# Patient Record
Sex: Male | Born: 1965 | Race: Asian | Hispanic: No | Marital: Married | Smoking: Never smoker
Health system: Southern US, Community
[De-identification: ages and names within clinical notes are randomized; demographics above are authoritative.]

## PROBLEM LIST (undated history)

## (undated) ENCOUNTER — Emergency Department (HOSPITAL_COMMUNITY): Admission: EM | Discharge status: Absent without leave | Payer: Self-pay

## (undated) DIAGNOSIS — I10 Essential (primary) hypertension: Secondary | ICD-10-CM

## (undated) DIAGNOSIS — R7303 Prediabetes: Secondary | ICD-10-CM

## (undated) HISTORY — DX: Essential (primary) hypertension: I10

## (undated) HISTORY — PX: HERNIA REPAIR: SHX51

## (undated) HISTORY — DX: Prediabetes: R73.03

---

## 2006-03-24 ENCOUNTER — Inpatient Hospital Stay (HOSPITAL_COMMUNITY): Admission: EM | Admit: 2006-03-24 | Discharge: 2006-03-25 | Payer: Self-pay | Admitting: Emergency Medicine

## 2015-06-13 ENCOUNTER — Emergency Department (HOSPITAL_BASED_OUTPATIENT_CLINIC_OR_DEPARTMENT_OTHER)
Admission: EM | Admit: 2015-06-13 | Discharge: 2015-06-13 | Disposition: A | Discharge status: Home / Self Care | Payer: Worker's Compensation | Attending: Emergency Medicine | Admitting: Emergency Medicine

## 2015-06-13 ENCOUNTER — Emergency Department (HOSPITAL_BASED_OUTPATIENT_CLINIC_OR_DEPARTMENT_OTHER): Payer: Worker's Compensation

## 2015-06-13 ENCOUNTER — Encounter (HOSPITAL_BASED_OUTPATIENT_CLINIC_OR_DEPARTMENT_OTHER): Payer: Self-pay | Admitting: *Deleted

## 2015-06-13 DIAGNOSIS — Y288XXA Contact with other sharp object, undetermined intent, initial encounter: Secondary | ICD-10-CM | POA: Insufficient documentation

## 2015-06-13 DIAGNOSIS — IMO0002 Reserved for concepts with insufficient information to code with codable children: Secondary | ICD-10-CM

## 2015-06-13 DIAGNOSIS — Y990 Civilian activity done for income or pay: Secondary | ICD-10-CM | POA: Diagnosis not present

## 2015-06-13 DIAGNOSIS — Y9289 Other specified places as the place of occurrence of the external cause: Secondary | ICD-10-CM | POA: Diagnosis not present

## 2015-06-13 DIAGNOSIS — S61212A Laceration without foreign body of right middle finger without damage to nail, initial encounter: Secondary | ICD-10-CM | POA: Insufficient documentation

## 2015-06-13 DIAGNOSIS — Z23 Encounter for immunization: Secondary | ICD-10-CM | POA: Insufficient documentation

## 2015-06-13 DIAGNOSIS — Y9389 Activity, other specified: Secondary | ICD-10-CM | POA: Diagnosis not present

## 2015-06-13 MED ORDER — LIDOCAINE HCL (PF) 1 % IJ SOLN
5.0000 mL | Freq: Once | INTRAMUSCULAR | Status: DC
  Filled 2015-06-13: qty 5

## 2015-06-13 MED ORDER — TETANUS-DIPHTH-ACELL PERTUSSIS 5-2.5-18.5 LF-MCG/0.5 IM SUSP
0.5000 mL | Freq: Once | INTRAMUSCULAR | Status: AC
  Administered 2015-06-13: 0.5 mL via INTRAMUSCULAR
  Filled 2015-06-13: qty 0.5

## 2015-06-13 NOTE — ED Provider Notes (Signed)
CSN: [id number]     Arrival date & time 06/13/15  1728 History   First MD Initiated Contact with Patient 06/13/15 2102     Chief Complaint  Patient presents with  . Extremity Laceration   HPI   49 year old male presents today with a laceration to his right third finger. Patient does not speak [place], his niece was present at the time of evaluation. Patient was asked if he would like an interpreter he refused reporting that he would like her to interpret. Patient was at work when a piece of machinery sliced his finger, he reports pain is minimal, bleeding controlled with direct pressure minimal amount of blood loss. Patient reports his tetanus is not up-to-date. He has full active resisted flexion and extension of the affected fingers and hands. No history of diabetes or wound infections. No other complaints  No past medical history on file. Past Surgical History  Procedure Laterality Date  . Hernia repair     No family history on file. Social History  Substance Use Topics  . Smoking status: Never Smoker   . Smokeless tobacco: Not on file  . Alcohol Use: No    Review of Systems  All other systems reviewed and are negative.    Allergies  Review of patient's allergies indicates no known allergies.  Home Medications   Prior to Admission medications   Not on File   BP 164/109 mmHg  Pulse 66  Temp(Src) 98.4 F (36.9 C) (Oral)  Resp 20  Ht  (1.727 m)  Wt 175 lb (79.379 kg)  BMI 26.61 kg/m2  SpO2 99% Physical Exam  Constitutional: He is oriented to person, place, and time. He appears well-developed and well-nourished.  HENT:  Head: Normocephalic and atraumatic.  Eyes: Conjunctivae are normal. Pupils are equal, round, and reactive to light. Right eye exhibits no discharge. Left eye exhibits no discharge. No scleral icterus.  Neck: Normal range of motion. No JVD present. No tracheal deviation present.  Pulmonary/Chest: Effort normal. No stridor.  Neurological: He is  alert and oriented to person, place, and time. Coordination normal.  Skin:  1.5 cm linear laceration to the palmar aspect of the third digit right hand, no active bleeding, no tendon, major vessel, nerve involvement. Patient has full resisted flexion and extension of the DIP PIP, and MCP. Sensation intact Refill less than 3 seconds  Psychiatric: He has a normal mood and affect. His behavior is normal. Judgment and thought content normal.  Nursing note and vitals reviewed.   ED Course  Procedures (including critical care time)  LACERATION REPAIR Performed by: Thermon Leyland Authorized by: Thermon Leyland Consent: Verbal consent obtained. Risks and benefits: risks, benefits and alternatives were discussed Consent given by: patient Patient identity confirmed: provided demographic data Prepped and Draped in normal sterile fashion Wound explored  Laceration Location: Palmer aspect of the third digit right hand  Laceration Length: 1.5 cm  No Foreign Bodies seen or palpated  Anesthesia: local infiltration  Local anesthetic: lidocaine 1 % 0 epinephrine  Anesthetic total: 2 ml  Irrigation method: syringe Amount of cleaning: standard  Skin closure: Simple interrupted   Number of sutures: 4   Technique: Approximation   Patient tolerance: Patient tolerated the procedure well with no immediate complications. Labs Review Labs Reviewed - No data to display  Imaging Review Dg Hand Complete Right  06/13/2015   CLINICAL DATA:  Laceration to the distal right middle finger on work machine. Initial encounter.  EXAM: RIGHT HAND - COMPLETE  3+ VIEW  COMPARISON:  None.  FINDINGS: There is no evidence of fracture or dislocation. The joint spaces are preserved. The carpal rows are intact, and demonstrate normal alignment. The known soft tissue laceration is characterized at the volar aspect of the distal third finger. No radiopaque foreign bodies are seen.  IMPRESSION: No evidence of  fracture or dislocation. No radiopaque foreign bodies seen.   Electronically Signed   By: Roanna Raider M.D.   On: 06/13/2015 22:00   I have personally reviewed and evaluated these images and lab results as part of my medical decision-making.   EKG Interpretation None      MDM   Final diagnoses:  Laceration    Labs:  Imaging:  Consults:  Therapeutics:  Discharge Meds:   Assessment/Plan: Patient presents with a simple laceration, plain films show no foreign body or bone involvement. Wound is repaired, tetanus updated. She given strict return precautions and instructions for suture removal. Patient verbalizes understanding and agreement for today's plan.         Eyvonne Mechanic, PA-C 06/15/15 1547  Geoffery Lyons, MD 06/15/15 [phone]

## 2015-06-13 NOTE — ED Notes (Signed)
Daughter assisted with answering questions secondary to pt does not speak english very well, she states she is coming to the ED to be with father , currently pts supervisor is here with pt

## 2015-06-13 NOTE — ED Notes (Signed)
Laceration on work machine approx 1645 hours today, injury to rt middle finger, arrived with dsg in place

## 2015-06-13 NOTE — Discharge Instructions (Signed)
Please reattach information, please keep wound clean and covered. Please monitor for signs of infection and return immediately if any present. Please have sutures removed in 5-7 days.

## 2019-06-02 ENCOUNTER — Other Ambulatory Visit: Payer: Self-pay

## 2019-06-02 ENCOUNTER — Emergency Department (HOSPITAL_COMMUNITY): Payer: HRSA Program

## 2019-06-02 ENCOUNTER — Emergency Department (HOSPITAL_COMMUNITY)
Admission: EM | Admit: 2019-06-02 | Discharge: 2019-06-03 | Disposition: A | Discharge status: Home / Self Care | Payer: HRSA Program | Attending: Emergency Medicine | Admitting: Emergency Medicine

## 2019-06-02 DIAGNOSIS — R0789 Other chest pain: Secondary | ICD-10-CM | POA: Diagnosis not present

## 2019-06-02 DIAGNOSIS — R112 Nausea with vomiting, unspecified: Secondary | ICD-10-CM | POA: Insufficient documentation

## 2019-06-02 DIAGNOSIS — U071 COVID-19: Secondary | ICD-10-CM | POA: Insufficient documentation

## 2019-06-02 DIAGNOSIS — R5383 Other fatigue: Secondary | ICD-10-CM | POA: Diagnosis present

## 2019-06-02 DIAGNOSIS — R05 Cough: Secondary | ICD-10-CM | POA: Insufficient documentation

## 2019-06-02 DIAGNOSIS — R5381 Other malaise: Secondary | ICD-10-CM | POA: Insufficient documentation

## 2019-06-02 LAB — CBC
HCT: 48.8 % (ref 39.0–52.0)
Hemoglobin: 15.9 g/dL (ref 13.0–17.0)
MCH: 25.7 pg — ABNORMAL LOW (ref 26.0–34.0)
MCHC: 32.6 g/dL (ref 30.0–36.0)
MCV: 79 fL — ABNORMAL LOW (ref 80.0–100.0)
Platelets: 154 10*3/uL (ref 150–400)
RBC: 6.18 MIL/uL — ABNORMAL HIGH (ref 4.22–5.81)
RDW: 13.5 % (ref 11.5–15.5)
WBC: 7 10*3/uL (ref 4.0–10.5)
nRBC: 0 % (ref 0.0–0.2)

## 2019-06-02 LAB — BASIC METABOLIC PANEL
Anion gap: 14 (ref 5–15)
BUN: 14 mg/dL (ref 6–20)
CO2: 17 mmol/L — ABNORMAL LOW (ref 22–32)
Calcium: 8.7 mg/dL — ABNORMAL LOW (ref 8.9–10.3)
Chloride: 99 mmol/L (ref 98–111)
Creatinine, Ser: 1.26 mg/dL — ABNORMAL HIGH (ref 0.61–1.24)
GFR calc Af Amer: >60 mL/min (ref >60)
GFR calc non Af Amer: >60 mL/min (ref >60)
Glucose, Bld: 109 mg/dL — ABNORMAL HIGH (ref 70–99)
Potassium: 4 mmol/L (ref 3.5–5.1)
Sodium: 130 mmol/L — ABNORMAL LOW (ref 135–145)

## 2019-06-02 LAB — TROPONIN I (HIGH SENSITIVITY): Troponin I (High Sensitivity): 8 ng/L (ref <18)

## 2019-06-02 LAB — SARS CORONAVIRUS 2 BY RT PCR (HOSPITAL ORDER, PERFORMED IN ~~LOC~~ HOSPITAL LAB): SARS Coronavirus 2: POSITIVE — AB

## 2019-06-02 MED ORDER — ACETAMINOPHEN 325 MG PO TABS
650.0000 mg | ORAL_TABLET | Freq: Once | ORAL | Status: AC
Start: 2019-06-02 — End: 2019-06-03
  Administered 2019-06-03: 650 mg via ORAL
  Filled 2019-06-02: qty 2

## 2019-06-02 NOTE — ED Triage Notes (Signed)
Patient c/o CP, cough, fever and being unable to eat x3 days.

## 2019-06-02 NOTE — ED Notes (Signed)
Unable to update vitals as pt is in room in green waiting for a room to see the provider

## 2019-06-03 LAB — HEPATIC FUNCTION PANEL
ALT: 43 U/L (ref 0–44)
AST: 49 U/L — ABNORMAL HIGH (ref 15–41)
Albumin: 3.7 g/dL (ref 3.5–5.0)
Alkaline Phosphatase: 131 U/L — ABNORMAL HIGH (ref 38–126)
Bilirubin, Direct: 0.2 mg/dL (ref 0.0–0.2)
Indirect Bilirubin: 0.7 mg/dL (ref 0.3–0.9)
Total Bilirubin: 0.9 mg/dL (ref 0.3–1.2)
Total Protein: 7.7 g/dL (ref 6.5–8.1)

## 2019-06-03 LAB — LIPASE, BLOOD: Lipase: 56 U/L — ABNORMAL HIGH (ref 11–51)

## 2019-06-03 LAB — TROPONIN I (HIGH SENSITIVITY): Troponin I (High Sensitivity): 7 ng/L (ref <18)

## 2019-06-03 MED ORDER — ONDANSETRON 4 MG PO TBDP: 4.0000 mg | ORAL_TABLET | Freq: Three times a day (TID) | ORAL | 0 refills | Status: AC | PRN

## 2019-06-03 MED ORDER — ONDANSETRON 4 MG PO TBDP
4.0000 mg | ORAL_TABLET | Freq: Once | ORAL | Status: AC
  Administered 2019-06-03: 4 mg via ORAL
  Filled 2019-06-03: qty 1

## 2019-06-03 MED ORDER — LIDOCAINE VISCOUS HCL 2 % MT SOLN
15.0000 mL | Freq: Once | OROMUCOSAL | Status: AC
  Administered 2019-06-03: 15 mL via ORAL
  Filled 2019-06-03: qty 15

## 2019-06-03 MED ORDER — ALUM & MAG HYDROXIDE-SIMETH 400-400-40 MG/5ML PO SUSP: 15.0000 mL | Freq: Four times a day (QID) | ORAL | 0 refills | Status: DC | PRN

## 2019-06-03 MED ORDER — ALUM & MAG HYDROXIDE-SIMETH 200-200-20 MG/5ML PO SUSP
30.0000 mL | Freq: Once | ORAL | Status: AC
  Administered 2019-06-03: 30 mL via ORAL
  Filled 2019-06-03: qty 30

## 2019-06-03 NOTE — ED Provider Notes (Signed)
[facility] Provider Note  CSN: [id number] Arrival date & time: 06/02/19 2114  Chief Complaint(s) Chest Pain  HPI Cameron Allen is a 53 y.o. [place] male here with 4 days of generalized fatigue, malaise, dry cough, and chest discomfort with eating.  Patient unable to describe the chest discomfort but states it is associated with eating.  States that every time he tries to drink or eat something he throws up.  Denies any bloody emesis.  No known sick contacts.  No suspicious food intake.  Denies any shortness of breath but is notably tachypneic.  No abdominal pain. No diarrhea.   The history is limited by a language barrier. A language interpreter was used.    Past Medical History No past medical history on file. There are no active problems to display for this patient.  Home Medication(s) Prior to Admission medications   Medication Sig Start Date End Date Taking? Authorizing Provider  alum & mag hydroxide-simeth (MAALOX ADVANCED MAX ST) 400-400-40 MG/5ML suspension Take 15 mLs by mouth every 6 (six) hours as needed for indigestion. 06/03/19   Nira Connardama, [facility] Eduardo, MD  ondansetron (ZOFRAN ODT) 4 MG disintegrating tablet Take 1 tablet (4 mg total) by mouth every 8 (eight) hours as needed for up to 3 days for nausea or vomiting. 06/03/19 06/06/19  Dina Warbington, Amadeo GarnetPedro Eduardo, MD                                                                                                                                    Past Surgical History ** The histories are not reviewed yet. Please review them in the "History" navigator section and refresh this SmartLink. Family History No family history on file.  Social History Social History   Tobacco Use  . Smoking status: Never Smoker  Substance Use Topics  . Alcohol use: No  . Drug use: No   Allergies Patient has no known allergies.  Review of Systems Review of Systems All other systems are reviewed and are  negative for acute change except as noted in the HPI  Physical Exam Vital Signs  I have reviewed the triage vital signs BP (!) 175/104 (BP Location: Right Arm)   Pulse 91   Temp 99.4 F (37.4 C) (Oral)   Resp (!) 30   SpO2 98%   Physical Exam Vitals signs reviewed.  Constitutional:      General: He is not in acute distress.    Appearance: He is well-developed. He is not diaphoretic.  HENT:     Head: Normocephalic and atraumatic.     Nose: Nose normal.  Eyes:     General: No scleral icterus.       Right eye: No discharge.        Left eye: No discharge.     Conjunctiva/sclera: Conjunctivae normal.     Pupils: Pupils are equal, round, and reactive to light.  Neck:  Musculoskeletal: Normal range of motion and neck supple.  Cardiovascular:     Rate and Rhythm: Normal rate and regular rhythm.     Heart sounds: No murmur. No friction rub. No gallop.   Pulmonary:     Effort: Pulmonary effort is normal. Tachypnea present. No respiratory distress.     Breath sounds: No stridor. Examination of the right-lower field reveals rales. Examination of the left-lower field reveals rales. Rales present.  Abdominal:     General: There is no distension.     Palpations: Abdomen is soft.     Tenderness: There is no abdominal tenderness.  Musculoskeletal:        General: No tenderness.  Skin:    General: Skin is warm and dry.     Findings: No erythema or rash.  Neurological:     Mental Status: He is alert and oriented to person, place, and time.     ED Results and Treatments Labs (all labs ordered are listed, but only abnormal results are displayed) Labs Reviewed  SARS CORONAVIRUS 2 (HOSPITAL ORDER, [facility]) - Abnormal; Notable for the following components:      Result Value   SARS Coronavirus 2 POSITIVE (*)    All other components within normal limits  BASIC METABOLIC PANEL - Abnormal; Notable for the following components:   Sodium 130 (*)    CO2 17  (*)    Glucose, Bld 109 (*)    Creatinine, Ser 1.26 (*)    Calcium 8.7 (*)    All other components within normal limits  CBC - Abnormal; Notable for the following components:   RBC 6.18 (*)    MCV 79.0 (*)    MCH 25.7 (*)    All other components within normal limits  HEPATIC FUNCTION PANEL - Abnormal; Notable for the following components:   AST 49 (*)    Alkaline Phosphatase 131 (*)    All other components within normal limits  LIPASE, BLOOD - Abnormal; Notable for the following components:   Lipase 56 (*)    All other components within normal limits  TROPONIN I (HIGH SENSITIVITY)  TROPONIN I (HIGH SENSITIVITY)                                                                                                                         EKG Not crossed over from MUSE      Radiology Dg Chest Portable 1 View  Result Date: 06/02/2019 CLINICAL DATA:  53 year old male with chest pain. EXAM: PORTABLE CHEST 1 VIEW COMPARISON:  None. FINDINGS: Bibasilar hazy densities likely represent atelectatic changes. Infiltrate is less likely but not excluded. Clinical correlation is recommended. No focal consolidation, pleural effusion, or pneumothorax. Top-normal cardiac size. No acute osseous pathology. Old healed left posterior rib fractures. IMPRESSION: Bibasilar hazy densities, likely atelectatic changes. Infiltrate is less likely but not excluded. Electronically Signed   By: Anner Crete M.D.   On: 06/02/2019 22:06    Pertinent  labs & imaging results that were available during my care of the patient were reviewed by me and considered in my medical decision making (see chart for details).  Medications Ordered in ED Medications  acetaminophen (TYLENOL) tablet 650 mg (650 mg Oral Given 06/03/19 0110)  ondansetron (ZOFRAN-ODT) disintegrating tablet 4 mg (4 mg Oral Given 06/03/19 0111)  alum & mag hydroxide-simeth (MAALOX/MYLANTA) 200-200-20 MG/5ML suspension 30 mL (30 mLs Oral Given 06/03/19 0113)     And  lidocaine (XYLOCAINE) 2 % viscous mouth solution 15 mL (15 mLs Oral Given 06/03/19 0113)                                                                                                                                    Procedures Procedures  (including critical care time)  Medical Decision Making / ED Course I have reviewed the nursing notes for this encounter and the patient's prior records (if available in EHR or on provided paperwork).   Cameron Allen was evaluated in Emergency Department on 06/03/2019 for the symptoms described in the history of present illness. He was evaluated in the context of the global COVID-19 pandemic, which necessitated consideration that the patient might be at risk for infection with the SARS-CoV-2 virus that causes COVID-19. Institutional protocols and algorithms that pertain to the evaluation of patients at risk for COVID-19 are in a state of rapid change based on information released by regulatory bodies including the CDC and federal and state organizations. These policies and algorithms were followed during the patient's care in the ED.  Patient presents with viral symptoms for 4 days. decreased oral hydration. Rest of history as above.  Patient appears ill. No signs of toxicity, patient is interactive. No hypoxia, but tachypneic to 30s and 40s. Lung exam with bibasilar rales. Rest of exam as above.  Most consistent with viral illness. Covid +  No evidence suggestive of pharyngitis, AOM, or meningitis.  EKG with LBBB (no priors for comparison). trops negative x2. Pain inconsistent with ACS. Doubt PE.   Chest x-ray notable for likely viral pneumonia. No pneumothorax, pneumomediastinum.  No abnormal contour of the mediastinum to suggest dissection.   Mild AKI likely from dehydration. No evidence of biliary obstruction or pancreatitis.   Treated with Zofran, GI cocktail and oral hydration.   Able to tolerate oral intake. Pain and respiratory rate  improved.   .The patient is safe for discharge with strict return precautions.      Final Clinical Impression(s) / ED Diagnoses Final diagnoses:  COVID-19 virus infection  Non-intractable vomiting with nausea, unspecified vomiting type  Chest discomfort    The patient appears reasonably screened and/or stabilized for discharge and I doubt any other medical condition or other [facility] requiring further screening, evaluation, or treatment in the ED at this time prior to discharge.  Disposition: Discharge  Condition: Good  I have discussed the results, Dx and Tx plan with the patient  who expressed understanding and agree(s) with the plan. Discharge instructions discussed at great length. The patient was given strict return precautions who verbalized understanding of the instructions. No further questions at time of discharge.    ED Discharge Orders         Ordered    ondansetron (ZOFRAN ODT) 4 MG disintegrating tablet  Every 8 hours PRN     06/03/19 0349    alum & mag hydroxide-simeth (MAALOX ADVANCED MAX ST) 400-400-40 MG/5ML suspension  Every 6 hours PRN     06/03/19 0349           Follow Up: Primary care provider  Schedule an appointment as soon as possible for a visit  As needed      This chart was dictated using voice recognition software.  Despite best efforts to proofread,  errors can occur which can change the documentation meaning.   Nira Connardama, Ludella Pranger Eduardo, MD 06/03/19 [phone]

## 2019-06-03 NOTE — ED Notes (Signed)
The pt is asleep easy respirations

## 2019-06-03 NOTE — ED Notes (Signed)
[place] given

## 2020-11-27 ENCOUNTER — Ambulatory Visit: Payer: BC Managed Care – PPO | Attending: Internal Medicine

## 2020-11-27 DIAGNOSIS — Z23 Encounter for immunization: Secondary | ICD-10-CM

## 2020-11-27 NOTE — Progress Notes (Signed)
   Covid-19 Vaccination Clinic  Name:  Math Brazie    MRN: [id number] DOB: 1966/06/13  11/27/2020  Mr. Wilbanks was observed post Covid-19 immunization for 15 minutes without incident. He was provided with Vaccine Information Sheet and instruction to access the V-Safe system.   Mr. Stettler was instructed to call 911 with any severe reactions post vaccine: Marland Kitchen Difficulty breathing  . Swelling of face and throat  . A fast heartbeat  . A bad rash all over body  . Dizziness and weakness   Immunizations Administered    Name Date Dose VIS Date Route   JANSSEN COVID-19 VACCINE 11/27/2020 11:09 AM 0.5 mL 08/12/2020 Intramuscular   Manufacturer: [employer]   Lot: [id number]   NDC: [phone]

## 2021-09-24 ENCOUNTER — Emergency Department (HOSPITAL_BASED_OUTPATIENT_CLINIC_OR_DEPARTMENT_OTHER): Payer: No Typology Code available for payment source

## 2021-09-24 ENCOUNTER — Emergency Department (HOSPITAL_BASED_OUTPATIENT_CLINIC_OR_DEPARTMENT_OTHER)
Admission: EM | Admit: 2021-09-24 | Discharge: 2021-09-25 | Disposition: A | Discharge status: Home / Self Care | Payer: No Typology Code available for payment source | Attending: Emergency Medicine | Admitting: Emergency Medicine

## 2021-09-24 ENCOUNTER — Encounter (HOSPITAL_BASED_OUTPATIENT_CLINIC_OR_DEPARTMENT_OTHER): Payer: Self-pay | Admitting: Emergency Medicine

## 2021-09-24 ENCOUNTER — Emergency Department (HOSPITAL_BASED_OUTPATIENT_CLINIC_OR_DEPARTMENT_OTHER): Payer: No Typology Code available for payment source | Admitting: Radiology

## 2021-09-24 ENCOUNTER — Other Ambulatory Visit: Payer: Self-pay

## 2021-09-24 DIAGNOSIS — M542 Cervicalgia: Secondary | ICD-10-CM | POA: Insufficient documentation

## 2021-09-24 DIAGNOSIS — M25522 Pain in left elbow: Secondary | ICD-10-CM | POA: Insufficient documentation

## 2021-09-24 DIAGNOSIS — Y92410 Unspecified street and highway as the place of occurrence of the external cause: Secondary | ICD-10-CM | POA: Insufficient documentation

## 2021-09-24 DIAGNOSIS — R519 Headache, unspecified: Secondary | ICD-10-CM | POA: Insufficient documentation

## 2021-09-24 DIAGNOSIS — R0789 Other chest pain: Secondary | ICD-10-CM | POA: Insufficient documentation

## 2021-09-24 MED ORDER — KETOROLAC TROMETHAMINE 30 MG/ML IJ SOLN
30.0000 mg | Freq: Once | INTRAMUSCULAR | Status: AC
  Administered 2021-09-24: 30 mg via INTRAMUSCULAR
  Filled 2021-09-24: qty 1

## 2021-09-24 NOTE — ED Notes (Signed)
Per [facility], they do not have [place] interpreter available. Pt has friend to call whose english is better. As this is our only option, we will use this angle to be able to care for pt.
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
## 2021-09-24 NOTE — ED Notes (Signed)
[place] interpreter accessed via [facility] [employer] [facility] (voice); no video interpreter available in this language.
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
## 2021-09-25 NOTE — ED Notes (Signed)
Pt discharged home after verbalizing understanding of discharge instructions; nad noted.  Translation through friend as no interpreter available either by video or phone.
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
## 2023-02-17 ENCOUNTER — Telehealth: Payer: Self-pay

## 2023-02-17 NOTE — Telephone Encounter (Signed)
Home visit to remind patient of appointment on 02/20/2023 at 10:45 am with [facility] per request from [facility] at the clinic.  Patient was not home but his roommate was able to call him and CN spoke with him.  He requested address of appointment.  CN left written information with time, location and phone number of facility.  Told patient he could take written note and ask someone at entrance [facility]) to direct him to the clinic.  Brantley Fling RN, Congregational Nurse [phone]

## 2023-02-20 ENCOUNTER — Ambulatory Visit: Payer: BC Managed Care – PPO | Admitting: Student

## 2023-02-20 VITALS — BP 163/97 | HR 72 | Temp 97.9°F | Ht 66.0 in | Wt 180.0 lb

## 2023-02-20 DIAGNOSIS — R748 Abnormal levels of other serum enzymes: Secondary | ICD-10-CM

## 2023-02-20 DIAGNOSIS — R7303 Prediabetes: Secondary | ICD-10-CM | POA: Diagnosis not present

## 2023-02-20 DIAGNOSIS — Z1211 Encounter for screening for malignant neoplasm of colon: Secondary | ICD-10-CM

## 2023-02-20 DIAGNOSIS — Z114 Encounter for screening for human immunodeficiency virus [HIV]: Secondary | ICD-10-CM

## 2023-02-20 DIAGNOSIS — Z13220 Encounter for screening for lipoid disorders: Secondary | ICD-10-CM

## 2023-02-20 DIAGNOSIS — I10 Essential (primary) hypertension: Secondary | ICD-10-CM

## 2023-02-20 DIAGNOSIS — Z1159 Encounter for screening for other viral diseases: Secondary | ICD-10-CM

## 2023-02-20 DIAGNOSIS — Z0000 Encounter for general adult medical examination without abnormal findings: Secondary | ICD-10-CM

## 2023-02-20 DIAGNOSIS — R21 Rash and other nonspecific skin eruption: Secondary | ICD-10-CM

## 2023-02-20 DIAGNOSIS — E119 Type 2 diabetes mellitus without complications: Secondary | ICD-10-CM

## 2023-02-20 MED ORDER — LOSARTAN POTASSIUM-HCTZ 50-12.5 MG PO TABS: 1.0000 | ORAL_TABLET | Freq: Every day | ORAL | 5 refills | Status: DC

## 2023-02-20 MED ORDER — TRIAMCINOLONE ACETONIDE 0.1 % EX OINT: 1.0000 | TOPICAL_OINTMENT | Freq: Two times a day (BID) | CUTANEOUS | 1 refills | Status: DC

## 2023-02-20 NOTE — Patient Instructions (Signed)
Thank you, Mr.Shann Thong for allowing [place] to provide your care today. Today we establish you in our clinic and discussed your diabetes, rash and high blood pressure.    I have ordered the following labs for you:   Lab Orders         Hemoglobin A1c         CMP14 + Anion Gap         CBC no Diff         HIV antibody (with reflex)         Hepatitis C Ab reflex to Quant PCR         Lipid Profile      I will call if any are abnormal. All of your labs can be accessed through "My Chart".  I have place a referrals to GI for colonoscopy  I have ordered the following medication/changed the following medications:  Start Hyzaar 50-12.5 mg daily Start triamcinolone ointment 0.1% twice daily  My Chart Access: [email or web address]  Please follow-up in 4 weeks  Please make sure to arrive 15 minutes prior to your next appointment. If you arrive late, you may be asked to reschedule.    We look forward to seeing you next time. Please call our clinic at [phone] if you have any questions or concerns. The best time to call is Monday-Friday from 9am-4pm, but there is someone available 24/7. If after hours or the weekend, call the main hospital number and ask for the Internal Medicine Resident On-Call. If you need medication refills, please notify your pharmacy one week in advance and they will send [place] a request.   Thank you for letting us take part in your care. Wishing you the best!  Steffanie Rainwater, MD 02/20/2023, 11:50 AM IM Resident, PGY-3 Duwayne Heck 41:10
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
## 2023-02-21 LAB — CMP14 + ANION GAP
ALT: 25 IU/L (ref 0–44)
AST: 20 IU/L (ref 0–40)
Albumin/Globulin Ratio: 1.9 (ref 1.2–2.2)
Albumin: 4.5 g/dL (ref 3.8–4.9)
Alkaline Phosphatase: 144 IU/L — ABNORMAL HIGH (ref 44–121)
Anion Gap: 15 mmol/L (ref 10.0–18.0)
BUN/Creatinine Ratio: 12 (ref 9–20)
BUN: 11 mg/dL (ref 6–24)
Bilirubin Total: 0.2 mg/dL (ref 0.0–1.2)
CO2: 20 mmol/L (ref 20–29)
Calcium: 9.7 mg/dL (ref 8.7–10.2)
Chloride: 106 mmol/L (ref 96–106)
Creatinine, Ser: 0.94 mg/dL (ref 0.76–1.27)
Globulin, Total: 2.4 g/dL (ref 1.5–4.5)
Glucose: 103 mg/dL — ABNORMAL HIGH (ref 70–99)
Potassium: 4.1 mmol/L (ref 3.5–5.2)
Sodium: 141 mmol/L (ref 134–144)
Total Protein: 6.9 g/dL (ref 6.0–8.5)
eGFR: 95 mL/min/{1.73_m2} (ref >59)

## 2023-02-21 LAB — HCV AB W REFLEX TO QUANT PCR: HCV Ab: NONREACTIVE

## 2023-02-21 LAB — CBC
Hematocrit: 47.5 % (ref 37.5–51.0)
Hemoglobin: 14.8 g/dL (ref 13.0–17.7)
MCH: 25.1 pg — ABNORMAL LOW (ref 26.6–33.0)
MCHC: 31.2 g/dL — ABNORMAL LOW (ref 31.5–35.7)
MCV: 81 fL (ref 79–97)
Platelets: 233 10*3/uL (ref 150–450)
RBC: 5.9 x10E6/uL — ABNORMAL HIGH (ref 4.14–5.80)
RDW: 14.5 % (ref 11.6–15.4)
WBC: 6.3 10*3/uL (ref 3.4–10.8)

## 2023-02-21 LAB — LIPID PANEL
Chol/HDL Ratio: 3.9 ratio (ref 0.0–5.0)
Cholesterol, Total: 199 mg/dL (ref 100–199)
HDL: 51 mg/dL (ref >39)
LDL Chol Calc (NIH): 90 mg/dL (ref 0–99)
Triglycerides: 354 mg/dL — ABNORMAL HIGH (ref 0–149)
VLDL Cholesterol Cal: 58 mg/dL — ABNORMAL HIGH (ref 5–40)

## 2023-02-21 LAB — HIV ANTIBODY (ROUTINE TESTING W REFLEX): HIV Screen 4th Generation wRfx: NONREACTIVE

## 2023-02-21 LAB — HCV INTERPRETATION

## 2023-02-21 LAB — HEMOGLOBIN A1C
Est. average glucose Bld gHb Est-mCnc: 131 mg/dL
Hgb A1c MFr Bld: 6.2 % — ABNORMAL HIGH (ref 4.8–5.6)

## 2023-02-22 ENCOUNTER — Encounter: Payer: Self-pay | Admitting: Student

## 2023-02-22 DIAGNOSIS — I10 Essential (primary) hypertension: Secondary | ICD-10-CM | POA: Insufficient documentation

## 2023-02-22 DIAGNOSIS — Z0000 Encounter for general adult medical examination without abnormal findings: Secondary | ICD-10-CM | POA: Insufficient documentation

## 2023-02-22 DIAGNOSIS — R21 Rash and other nonspecific skin eruption: Secondary | ICD-10-CM | POA: Insufficient documentation

## 2023-02-22 DIAGNOSIS — R748 Abnormal levels of other serum enzymes: Secondary | ICD-10-CM | POA: Insufficient documentation

## 2023-02-22 DIAGNOSIS — R7303 Prediabetes: Secondary | ICD-10-CM | POA: Insufficient documentation

## 2023-02-22 DIAGNOSIS — E119 Type 2 diabetes mellitus without complications: Secondary | ICD-10-CM | POA: Insufficient documentation

## 2023-02-22 HISTORY — DX: Abnormal levels of other serum enzymes: R74.8

## 2023-02-22 NOTE — Assessment & Plan Note (Signed)
Patient states he was previously on medication for diabetes but does not remember the name. His A1c today is at 6.2% which puts him in the prediabetes range. I counseled patient on making lifestyle changes such as decreasing his of foods high in sugar and carbs and eating more whole grains, fish, and vegetables.  -Lifestyle modifications with dietary changes and exercise -Repeat A1c in 6 months

## 2023-02-22 NOTE — Assessment & Plan Note (Signed)
Cameron Allen is a 57 year old [place] man who presented with interpreter today to establish care and to start treatment for his hypertension. Patient tells me he was previously followed by [facility] many years ago but has not seen a doctor in about 4 years. Reports that he was on medications for diabetes and hypertension but has not been on any medications for the last 4 years. Recently he has had increased headache and dizziness and has noticed some swelling in his lower extremities. He denies any shortness of breath, chest pain or changes in his vision. His BP is elevated initially with SBP in the 180 but repeat improved to the 160s. I discussed with patient plan to start him on a combo pill with plan to follow-up in 4 weeks for repeat BP. Also counseled patient on decreasing his salt intake. Labs today shows normal kidney function with no electrolyte abnormalities. Vitals:   02/20/23 1056 02/20/23 1137  BP: (!) 180/103 (!) 163/97   Plan: -Start Hyzaar 50-12.5 mg daily -Follow-up in 4 weeks for repeat BP and BMP

## 2023-02-22 NOTE — Assessment & Plan Note (Signed)
Patient reports a rash he has had for some years. He tells me that the rash gets itchy at times. There is a rash on his right elbow and his back that have improved. There is one on his central chest that is red and itchy. On exam, he does have a few erythematous patches with scaling that have coalesced on his central chest. He also has some hypopigmented patches on his face near the ears.  The rashes on his arm and back have improved.  His rash is consistent with psoriasis. I will prescribe him a short course of triamcinolone 0.1% ointment to apply to the rash with plan to follow-up for reevaluation.  Plan: -Start triamcinolone 0.1% ointment apply to rash twice daily -Follow-up in 4 weeks

## 2023-02-22 NOTE — Assessment & Plan Note (Signed)
CMP shows normal AST/ALT and bilirubin but elevated alk phos to 144. On chart review, his alk phos was also elevated to 131 three years ago. He denies GI symptoms today. He will need repeat CMP with GGT at next office visit to help further determine the source.  -CMP and GGT at next office visit.

## 2023-02-22 NOTE — Assessment & Plan Note (Signed)
Hepatitis C and HIV screening negative today.  Referred to GI for colonoscopy.

## 2023-02-23 NOTE — Progress Notes (Signed)
A1c shows patient is in the prediabetic range. His LDL is goal. Discussed results with patient and counseled on lifestyle modifications.  Plan to further workup his elevated alk phos at his next office visit in 1 month.

## 2023-02-24 NOTE — Progress Notes (Signed)
Internal Medicine Clinic Attending  Case discussed with the resident at the time of the visit.  We reviewed the resident's history and exam and pertinent patient test results.  I agree with the assessment, diagnosis, and plan of care documented in the resident's note.  

## 2023-03-21 ENCOUNTER — Encounter: Payer: BC Managed Care – PPO | Admitting: Student

## 2023-03-24 ENCOUNTER — Ambulatory Visit: Payer: BC Managed Care – PPO | Admitting: Student

## 2023-03-24 ENCOUNTER — Encounter: Payer: Self-pay | Admitting: Student

## 2023-03-24 VITALS — BP 141/91 | HR 57 | Temp 97.6°F | Wt 179.9 lb

## 2023-03-24 DIAGNOSIS — I10 Essential (primary) hypertension: Secondary | ICD-10-CM | POA: Diagnosis not present

## 2023-03-24 DIAGNOSIS — M25542 Pain in joints of left hand: Secondary | ICD-10-CM

## 2023-03-24 DIAGNOSIS — M25541 Pain in joints of right hand: Secondary | ICD-10-CM | POA: Diagnosis not present

## 2023-03-24 DIAGNOSIS — G44219 Episodic tension-type headache, not intractable: Secondary | ICD-10-CM | POA: Diagnosis not present

## 2023-03-24 DIAGNOSIS — R21 Rash and other nonspecific skin eruption: Secondary | ICD-10-CM | POA: Diagnosis not present

## 2023-03-24 DIAGNOSIS — M130 Polyarthritis, unspecified: Secondary | ICD-10-CM | POA: Insufficient documentation

## 2023-03-24 MED ORDER — DICLOFENAC SODIUM 1 % EX GEL: 4.0000 g | Freq: Four times a day (QID) | CUTANEOUS | 2 refills | Status: DC

## 2023-03-24 MED ORDER — LOSARTAN POTASSIUM-HCTZ 50-12.5 MG PO TABS: 1.0000 | ORAL_TABLET | Freq: Two times a day (BID) | ORAL | 5 refills | Status: DC

## 2023-03-24 MED ORDER — TRIAMCINOLONE ACETONIDE 0.1 % EX OINT: 1.0000 | TOPICAL_OINTMENT | Freq: Two times a day (BID) | CUTANEOUS | 1 refills | Status: AC

## 2023-03-24 MED ORDER — ACETAMINOPHEN 500 MG PO TABS: 1000.0000 mg | ORAL_TABLET | Freq: Three times a day (TID) | ORAL | 2 refills | Status: DC | PRN

## 2023-03-24 NOTE — Patient Instructions (Addendum)
Thank you, [name] for allowing [place] to provide your care today. Today we discussed   Your blood pressure - Take Hyzaar (losartan Hydrochlorothiazide) 50-12.5 mg every 12 hours - keep paying attention to the amount of sodium and salt in your foods - I am checking your kidney function today  Headaches I have ordered some blood tests for you - In the meantime, stop taking the [facility] powder because the medication you take for your blood pressure and the BC powders could injure your kidneys - Can take Tylenol 1000 mg every 8 hours as you needed for headaches - Stay hydrated  Feet and hand pain - The tylenol will help with this - I have also checked a couple of blood tests to check for potential causes   I have ordered the following labs for you:  Lab Orders         Sed Rate (ESR)         CRP (C-Reactive Protein)         CYCLIC CITRUL PEPTIDE ANTIBODY, IGG/IGA         Rheumatoid (RA) Factor         BMP8+Anion Gap       I will call if any are abnormal. All of your labs can be accessed through "My Chart".   My Chart Access: [email or web address]  Please follow-up in: 1 month    We look forward to seeing you next time. Please call our clinic at [phone] if you have any questions or concerns. The best time to call is Monday-Friday from 9am-4pm, but there is someone available 24/7. If after hours or the weekend, call the main hospital number and ask for the Internal Medicine Resident On-Call. If you need medication refills, please notify your pharmacy one week in advance and they will send [place] a request.   Thank you for letting us take part in your care. Wishing you the best!  Morene Crocker, MD 03/24/2023, 9:27 AM [facility] Internal Medicine Resident, PGY-1

## 2023-03-24 NOTE — Progress Notes (Signed)
Subjective:  CC: follow up  HPI:  [name] is a 57 y.o. male with a past medical history stated below and presents today for hypertension, headaches, and multiple joint pain. Please see problem based assessment and plan for additional details.  Past Medical History:  Diagnosis Date   Hypertension    Prediabetes     No current outpatient medications on file prior to visit.   No current facility-administered medications on file prior to visit.    No family history on file.  Social History   Socioeconomic History   Marital status: Married    Spouse name: Not on file   Number of children: Not on file   Years of education: Not on file   Highest education level: Not on file  Occupational History   Not on file  Tobacco Use   Smoking status: Never   Smokeless tobacco: Not on file  Substance and Sexual Activity   Alcohol use: No   Drug use: No   Sexual activity: Not on file  Other Topics Concern   Not on file  Social History Narrative   Not on file   Social Determinants of Health   Financial Resource Strain: Not on file  Food Insecurity: Not on file  Transportation Needs: Not on file  Physical Activity: Not on file  Stress: Not on file  Social Connections: Not on file  Intimate Partner Violence: Not on file    Review of Systems: ROS negative except for what is noted on the assessment and plan.  Objective:   Vitals:   03/24/23 0838  BP: (!) 141/91  Pulse: (!) 57  Temp: 97.6 F (36.4 C)  TempSrc: Oral  SpO2: 100%  Weight: 179 lb 14.4 oz (81.6 kg)    Physical Exam: Constitutional: well-appearing male sitting in chair, in no acute distress HENT: normocephalic atraumatic, mucous membranes moist Eyes: conjunctiva non-erythematous Neck: supple Cardiovascular: regular rate and rhythm, no m/r/g. Radial, DP and PT pulses present and symmetrical Pulmonary/Chest: normal work of breathing on room air, lungs clear to auscultation bilaterally Abdominal:  soft, non-tender, non-distended MSK: normal bulk and tone Neurological: alert & oriented x 3, 5/5 strength in bilateral upper and lower  - see the hand and feet examination in the assessment and plan. normal gait Skin: warm and dry Psych: Pleasant mood and affect     Assessment & Plan:   Hypertension Patient presents to clinic for hypertension follow up. He reports his BP at home have been ranging from SPB 130-160/80-95. He reports episodic temporal headaches that prompt him to check his blood pressure and it is usually in the higher spectrum of the range. No chest pain, n/v/dizziness, or other associated symptoms during these episodes (for more information on HA, see headache A/P).  Patient has been  monitoring his sodium and salt intake at home.   He has been adherent to Hyzaar 50-12.5 mg daily. He took the medication this AM.  Plan: Patient BP is still not at goal.  - Increase Hyzaar to BID dosing - BMP today - Return to clinic in 1 month for follow up  Episodic tension type headache Patient reports ~ 4 month history of episodic headaches in the bilateral temporal region/pericraneal region that come on during the day, last about 30 minutes,  He has been taking BC powders daily for this with good relief. Often associated with BP readings in the 140-160. No nausea, vomiting, aura, neck pain, vision changes, fevers, chest pain or pain with  exertion or valsalva, or tenderness in the temporal region. No eye pain or jaw claudication. No trauma  VS stable today. Cardiovascular and neurologic exam wnl today. No acute tenderness to palpation over scalp, temples, or neck.   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   Differential diagnosis: medication overuse headache, tension headache, and giant cell arteritis. Patient's blood pressure is not at goal but generally well controlled to suggest symptomatic hypertension at this time.  Plan: - Discussed  stopping BC powders for the next 4 weeks  - Switch to Tylenol every 8 hours daily PRN - ESR and CRP as initial test for GCA, though low index of suspicion at this time  Symmetrical polyarthritis Patient reports bilateral hand and foot pain for ~ 6 months. Specifically of the PIP joints that worsen with activity. There is morning stiffness but is ~ 20 min before he starts the day. This is particularly bothersome as patient works as a Pharmacologist and works Therapist, sports for multiple hours per day. No recent trauma.  On exam there is no swelling, erythema noted on examination of hand and feet joints. Mild restricted range of motion in the bilateral PIP joints without crepitus. No hand or toe deformities noted.Negative tinel sign.  Given the presentation suspect osteoarthritis. No evidence of synovitis on exam. However, because this presentation is symmetrical in bilateral PIP of hands and feet, will order anti CCP and RF to rule out possible RA.  - Given long history of BC powder use and ARB, do not favor NSAID use.  - Ordered Tylenol 1000 mg q8HR PRN for pain - Topical diclofenac gel, if relief continue TID - F/u anti CCP antibodies and RF test   Return in about 4 weeks (around 04/21/2023) for Hypertension and symetrical polyarthritis.  Patient discussed with Dr. Rosalia Hammers, MD [facility] - PGY-1 03/24/2023, 4:15 PM

## 2023-03-24 NOTE — Assessment & Plan Note (Addendum)
Patient reports bilateral hand and foot pain for ~ 6 months. Specifically of the PIP joints that worsen with activity. There is morning stiffness but is ~ 20 min before he starts the day. This is particularly bothersome as patient works as a Pharmacologist and works Therapist, sports for multiple hours per day. No recent trauma.  On exam there is no swelling, erythema noted on examination of hand and feet joints. Mild restricted range of motion in the bilateral PIP joints without crepitus. No hand or toe deformities noted.Negative tinel sign.  Given the presentation suspect osteoarthritis. No evidence of synovitis on exam. However, because this presentation is symmetrical in bilateral PIP of hands and feet, will order anti CCP and RF to rule out possible RA.  - Given long history of BC powder use and ARB, do not favor NSAID use.  - Ordered Tylenol 1000 mg q8HR PRN for pain - Topical diclofenac gel, if relief continue TID - F/u anti CCP antibodies and RF test

## 2023-03-24 NOTE — Assessment & Plan Note (Addendum)
Patient reports ~ 4 month history of episodic headaches in the bilateral temporal region/pericraneal region that come on during the day, last about 30 minutes,  He has been taking BC powders daily for this with good relief. Often associated with BP readings in the 140-160. No nausea, vomiting, aura, neck pain, vision changes, fevers, chest pain or pain with exertion or valsalva, or tenderness in the temporal region. No eye pain or jaw claudication. No trauma  VS stable today. Cardiovascular and neurologic exam wnl today. No acute tenderness to palpation over scalp, temples, or neck.   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   Differential diagnosis: medication overuse headache, tension headache, and giant cell arteritis. Patient's blood pressure is not at goal but generally well controlled to suggest symptomatic hypertension at this time.  Plan: - Discussed stopping BC powders for the next 4 weeks  - Switch to Tylenol every 8 hours daily PRN - ESR and CRP as initial test for GCA, though low index of suspicion at this time

## 2023-03-24 NOTE — Assessment & Plan Note (Signed)
Patient presents to clinic for hypertension follow up. He reports his BP at home have been ranging from SPB 130-160/80-95. He reports episodic temporal headaches that prompt him to check his blood pressure and it is usually in the higher spectrum of the range. No chest pain, n/v/dizziness, or other associated symptoms during these episodes (for more information on HA, see headache A/P).  Patient has been  monitoring his sodium and salt intake at home.   He has been adherent to Hyzaar 50-12.5 mg daily. He took the medication this AM.  Plan: Patient BP is still not at goal.  - Increase Hyzaar to BID dosing - BMP today - Return to clinic in 1 month for follow up

## 2023-03-27 NOTE — Progress Notes (Signed)
Internal Medicine Clinic Attending  Case discussed with Dr. Gomez-Caraballo  At the time of the visit.  We reviewed the resident's history and exam and pertinent patient test results.  I agree with the assessment, diagnosis, and plan of care documented in the resident's note.  

## 2023-03-28 LAB — BMP8+ANION GAP
Anion Gap: 14 mmol/L (ref 10.0–18.0)
BUN/Creatinine Ratio: 10 (ref 9–20)
BUN: 12 mg/dL (ref 6–24)
CO2: 22 mmol/L (ref 20–29)
Calcium: 10.3 mg/dL — ABNORMAL HIGH (ref 8.7–10.2)
Chloride: 104 mmol/L (ref 96–106)
Creatinine, Ser: 1.15 mg/dL (ref 0.76–1.27)
Glucose: 91 mg/dL (ref 70–99)
Potassium: 4.2 mmol/L (ref 3.5–5.2)
Sodium: 140 mmol/L (ref 134–144)
eGFR: 75 mL/min/{1.73_m2} (ref >59)

## 2023-03-28 LAB — C-REACTIVE PROTEIN: CRP: <1 mg/L (ref 0–10)

## 2023-03-28 LAB — SEDIMENTATION RATE: Sed Rate: 13 mm/hr (ref 0–30)

## 2023-03-28 LAB — RHEUMATOID FACTOR: Rheumatoid fact SerPl-aCnc: <10 IU/mL (ref <14.0)

## 2023-03-28 LAB — CYCLIC CITRUL PEPTIDE ANTIBODY, IGG/IGA: Cyclic Citrullin Peptide Ab: 6 units (ref 0–19)

## 2023-03-31 NOTE — Progress Notes (Signed)
Mildly increased potassium; first time. Please recheck at follow up. Otherwise, normal K and renal function. Normal ESR and CPR - low clinical suspicion for GCA at this time.  Anti CCP and RF negative. Will continue treatment for suspected osteoarthritis  Information relayed to the patient via [facility] from [facility] for translation into Cameron Allen as translation services through office was not possible for the past week.

## 2023-04-21 ENCOUNTER — Ambulatory Visit: Payer: BC Managed Care – PPO | Admitting: Student

## 2023-04-21 VITALS — BP 133/82 | HR 60 | Temp 97.8°F | Ht 66.0 in | Wt 179.4 lb

## 2023-04-21 DIAGNOSIS — Z1211 Encounter for screening for malignant neoplasm of colon: Secondary | ICD-10-CM

## 2023-04-21 DIAGNOSIS — M130 Polyarthritis, unspecified: Secondary | ICD-10-CM

## 2023-04-21 DIAGNOSIS — I10 Essential (primary) hypertension: Secondary | ICD-10-CM | POA: Diagnosis not present

## 2023-04-21 DIAGNOSIS — Z0000 Encounter for general adult medical examination without abnormal findings: Secondary | ICD-10-CM

## 2023-04-21 MED ORDER — LOSARTAN POTASSIUM-HCTZ 50-12.5 MG PO TABS: 1.0000 | ORAL_TABLET | Freq: Two times a day (BID) | ORAL | 3 refills | Status: DC

## 2023-04-21 MED ORDER — DICLOFENAC SODIUM 1 % EX GEL: 4.0000 g | Freq: Four times a day (QID) | CUTANEOUS | 2 refills | Status: DC

## 2023-04-21 NOTE — Patient Instructions (Addendum)
Thank you so much for coming to the clinic today!   We are going to check some blood work. Remember to only take TWO pills a day of the blood pressure medication. I am also giving you a written prescription for the blood pressure if you do leave early.   If you have any questions please feel free to the call the clinic at anytime at [phone]. It was a pleasure seeing you!  Best, Dr. Thomasene Ripple     C?m ?n b?n r?t nhi?u v ? ??n phng khm ngy hm nay!   Chng ti s? ki?m tra m?t s? cng vi?c mu. Hy nh? ch? u?ng HAI vin thu?c huy?t p m?i ngy. Ti c?ng s? ??a cho b?n m?t ??n thu?c ?o huy?t p n?u b?n v? s?m.   N?u b?n c b?t k? cu h?i no, vui lng g?i cho phng khm b?t c? lc no theo s? [phone]. Th?t vui khi ???c g?p b?n!  T?t nh?t, Ti?n s? Robb Sibal

## 2023-04-21 NOTE — Assessment & Plan Note (Signed)
Refilled voltaren

## 2023-04-21 NOTE — Progress Notes (Signed)
   CC: BP follow up  HPI:  Cameron Allen is a 57 y.o. male living with a history stated below and presents today for BP follow up. Please see problem based assessment and plan for additional details.  Past Medical History:  Diagnosis Date   Hypertension    Prediabetes     Current Outpatient Medications on File Prior to Visit  Medication Sig Dispense Refill   acetaminophen (TYLENOL) 500 MG tablet Take 2 tablets (1,000 mg total) by mouth every 8 (eight) hours as needed. 100 tablet 2   triamcinolone ointment (KENALOG) 0.1 % Apply 1 Application topically 2 (two) times daily. 30 g 1   No current facility-administered medications on file prior to visit.    No family history on file.  Social History   Socioeconomic History   Marital status: Married    Spouse name: Not on file   Number of children: Not on file   Years of education: Not on file   Highest education level: Not on file  Occupational History   Not on file  Tobacco Use   Smoking status: Never   Smokeless tobacco: Not on file  Substance and Sexual Activity   Alcohol use: No   Drug use: No   Sexual activity: Not on file  Other Topics Concern   Not on file  Social History Narrative   Not on file   Social Determinants of Health   Financial Resource Strain: Not on file  Food Insecurity: Not on file  Transportation Needs: Not on file  Physical Activity: Not on file  Stress: Not on file  Social Connections: Not on file  Intimate Partner Violence: Not on file    Review of Systems: ROS negative except for what is noted on the assessment and plan.  Vitals:   04/21/23 0938 04/21/23 1007  BP: (!) 141/79 133/82  Pulse: 61 60  Temp: 97.8 F (36.6 C)   TempSrc: Oral   SpO2: 99%   Weight: 179 lb 6.4 oz (81.4 kg)   Height: 5\' 6"  (1.676 m)     Physical Exam: Constitutional: well-appearing male  in no acute distress Cardiovascular: regular rate and rhythm, no m/r/g Pulmonary/Chest: normal work of breathing on  room air, lungs clear to auscultation bilaterally Abdominal: soft, non-tender, non-distended   Assessment & Plan:   Hypertension Pt presents as follow up for his blood pressure. At last visit, he was told to increase is Hyzaar dose to BID, however he is taking two pills twice a day, for a total dose of 200-50mg  daily. His blood pressure in the clinic is 133/82, and he had just taken his medications thirty minutes before. I reinforced for him to just take two tablets multiple times and he is understandable. He is also traveling to [place] to help his family come over, however is unsure when he will go. I have provided him with a paper prescription so if he does have to leave quickly he will be able to get his medications.   Plan:  - Refilled Hyzaar  - BMP today as pt was taking twice the prescribed dose for about a month  Symmetrical polyarthritis Refilled voltaren  Healthcare maintenance Referral placed for colonoscopy  Patient discussed with Dr. Maryagnes Amos Tyeson [place], M.D. [facility] Internal Medicine, PGY-1 Pager: [phone] Date 04/21/2023 Time 2:18 PM

## 2023-04-21 NOTE — Assessment & Plan Note (Signed)
Referral placed for colonoscopy. 

## 2023-04-21 NOTE — Assessment & Plan Note (Signed)
Pt presents as follow up for his blood pressure. At last visit, he was told to increase is Hyzaar dose to BID, however he is taking two pills twice a day, for a total dose of 200-50mg  daily. His blood pressure in the clinic is 133/82, and he had just taken his medications thirty minutes before. I reinforced for him to just take two tablets multiple times and he is understandable. He is also traveling to [place] to help his family come over, however is unsure when he will go. I have provided him with a paper prescription so if he does have to leave quickly he will be able to get his medications.   Plan:  - Refilled Hyzaar  - BMP today as pt was taking twice the prescribed dose for about a month

## 2023-04-23 LAB — BMP8+ANION GAP
Anion Gap: 15 mmol/L (ref 10.0–18.0)
BUN/Creatinine Ratio: 17 (ref 9–20)
BUN: 27 mg/dL — ABNORMAL HIGH (ref 6–24)
CO2: 21 mmol/L (ref 20–29)
Calcium: 9.5 mg/dL (ref 8.7–10.2)
Chloride: 105 mmol/L (ref 96–106)
Creatinine, Ser: 1.62 mg/dL — ABNORMAL HIGH (ref 0.76–1.27)
Glucose: 96 mg/dL (ref 70–99)
Potassium: 4 mmol/L (ref 3.5–5.2)
Sodium: 141 mmol/L (ref 134–144)
eGFR: 50 mL/min/{1.73_m2} — ABNORMAL LOW (ref >59)

## 2023-04-24 ENCOUNTER — Other Ambulatory Visit: Payer: Self-pay | Admitting: Student

## 2023-04-24 DIAGNOSIS — I10 Essential (primary) hypertension: Secondary | ICD-10-CM

## 2023-04-24 NOTE — Progress Notes (Signed)
Internal Medicine Clinic Attending  Case discussed with Dr. Nooruddin  At the time of the visit.  We reviewed the resident's history and exam and pertinent patient test results.  I agree with the assessment, diagnosis, and plan of care documented in the resident's note.  

## 2023-06-02 ENCOUNTER — Encounter: Payer: BC Managed Care – PPO | Admitting: Student

## 2023-10-06 ENCOUNTER — Encounter: Payer: Self-pay | Admitting: Student

## 2023-10-06 ENCOUNTER — Ambulatory Visit: Payer: BC Managed Care – PPO | Admitting: Student

## 2023-10-06 VITALS — BP 176/111 | HR 68 | Temp 97.7°F | Ht 66.0 in | Wt 186.4 lb

## 2023-10-06 DIAGNOSIS — M79672 Pain in left foot: Secondary | ICD-10-CM

## 2023-10-06 DIAGNOSIS — I10 Essential (primary) hypertension: Secondary | ICD-10-CM

## 2023-10-06 DIAGNOSIS — R519 Headache, unspecified: Secondary | ICD-10-CM | POA: Diagnosis not present

## 2023-10-06 DIAGNOSIS — M79671 Pain in right foot: Secondary | ICD-10-CM

## 2023-10-06 DIAGNOSIS — M130 Polyarthritis, unspecified: Secondary | ICD-10-CM | POA: Diagnosis not present

## 2023-10-06 MED ORDER — AMLODIPINE BESYLATE 5 MG PO TABS: 5.0000 mg | ORAL_TABLET | Freq: Every day | ORAL | 11 refills | Status: DC

## 2023-10-06 MED ORDER — ACETAMINOPHEN 500 MG PO TABS: 1000.0000 mg | ORAL_TABLET | Freq: Three times a day (TID) | ORAL | 2 refills | Status: DC | PRN

## 2023-10-06 MED ORDER — DICLOFENAC SODIUM 1 % EX GEL: 4.0000 g | Freq: Four times a day (QID) | CUTANEOUS | 2 refills | Status: DC

## 2023-10-06 NOTE — Progress Notes (Signed)
   CC: Headache  HPI:  [name] is a 57 y.o. male living with a history stated below and presents today for HTN follow up. Please see problem based assessment and plan for additional details.  Past Medical History:  Diagnosis Date   Hypertension    Prediabetes     Review of Systems: ROS negative except for what is noted on the assessment and plan.  Vitals:   10/06/23 0826  BP: (!) 176/111  Pulse: 68  Temp: 97.7 F (36.5 C)  TempSrc: Oral  SpO2: 100%  Weight: 186 lb 6.4 oz (84.6 kg)  Height: 5\' 6"  (1.676 m)    Physical Exam: Constitutional: well-appearing man, in no acute distress HENT: normocephalic atraumatic, mucous membranes moist Eyes: conjunctiva non-erythematous Cardiovascular: regular rate and rhythm, no m/r/g Pulmonary/Chest: normal work of breathing on room air, lungs clear to auscultation bilaterally Abdominal: soft, non-tender, non-distended MSK: normal bulk and tone. No swelling of Les, no tenderness. Full strength and ROM. Neurological: alert & oriented x 3, no focal deficit Skin: warm and dry Psych: normal mood and behavior  Assessment & Plan:   Patient seen with Dr. Sol Allen  Hypertension Blood pressure is elevated, 176/111.  He is adherent to his Hyzaar which is 50-12.5 twice daily, maximum dose.  He complains of dizziness and so orthostatics were gathered - orthostatics are negative.  However upon clarification, by referring to dizziness he does not mean room spinning, lightheadedness, or balance changes.  He means a headache, left side, some concomitant brief blurry vision, which happens for 1 to 2 minutes at a time, 1-2 times a daily but not every day, not associated with any particular activity or time, has been happening for about 3 years, and is not getting better or worse.  Usually these go away on their own after about 1 minute.  Does not have the symptoms presently.  As the symptoms are not progressive and as I do not believe he has a true  vertigo, I will first continue with blood pressure control.  I will start amlodipine. - Continue losartan hydrochlorothiazide 50-12.5 twice daily and start amlodipine 5 daily  Symmetrical polyarthritis He has chronic arthralgias at multiple sites, hands and feet.  This was worked up in the past with a rheumatologic workup that was negative.  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  He takes Goody powder and Voltaren gel for relief.  He is complaining of swelling of the legs.  Most likely I believe he has dependent edema.  However because he reports that the PIPs in the feet at times become acutely red and swollen I will check uric acid levels to rule out gout.  I am asking to check a BMP for his use of Goody powder, as he did have recent lab work that suggested an AKI, possible overuse of ibuprofen, with plans for lab follow-up, but unfortunately he did not make it to that appointment. - Check uric acid and BMP - Voltaren gel, Goody powder with limited use per NSAIDs, Tylenol 1000 every 8 hours as needed, recommend compression stockings at work  RTC one month for blood pressure.  Cameron Allen, D.O. [facility] Internal Medicine, PGY-1 Phone: [phone] Date 10/06/2023 Time 9:39 AM

## 2023-10-06 NOTE — Assessment & Plan Note (Addendum)
He has chronic arthralgias at multiple sites, hands and feet.  This was worked up in the past with a rheumatologic workup that was negative.  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  He takes Goody powder and Voltaren gel for relief.  He is complaining of swelling of the legs.  Most likely I believe he has dependent edema.  However because he reports that the PIPs in the feet at times become acutely red and swollen I will check uric acid levels to rule out gout.  I am asking to check a BMP for his use of Goody powder, as he did have recent lab work that suggested an AKI, possible overuse of ibuprofen, with plans for lab follow-up, but unfortunately he did not make it to that appointment. - Check uric acid and BMP - Voltaren gel, Goody powder with limited use per NSAIDs, Tylenol 1000 every 8 hours as needed, recommend compression stockings at work

## 2023-10-06 NOTE — Assessment & Plan Note (Signed)
Blood pressure is elevated, 176/111.  He is adherent to his Hyzaar which is 50-12.5 twice daily, maximum dose.  He complains of dizziness and so orthostatics were gathered - orthostatics are negative.  However upon clarification, by referring to dizziness he does not mean room spinning, lightheadedness, or balance changes.  He means a headache, left side, some concomitant brief blurry vision, which happens for 1 to 2 minutes at a time, 1-2 times a daily but not every day, not associated with any particular activity or time, has been happening for about 3 years, and is not getting better or worse.  Usually these go away on their own after about 1 minute.  Does not have the symptoms presently.  As the symptoms are not progressive and as I do not believe he has a true vertigo, I will first continue with blood pressure control.  I will start amlodipine. - Continue losartan hydrochlorothiazide 50-12.5 twice daily and start amlodipine 5 daily

## 2023-10-06 NOTE — Patient Instructions (Signed)
Take Losartan-hydrochlorothiazide "Hyzaar" twice daily  Take amlodipine once daily

## 2023-10-07 LAB — URIC ACID: Uric Acid: 7.9 mg/dL (ref 3.8–8.4)

## 2023-10-07 LAB — BMP8+ANION GAP
Anion Gap: 16 mmol/L (ref 10.0–18.0)
BUN/Creatinine Ratio: 12 (ref 9–20)
BUN: 15 mg/dL (ref 6–24)
CO2: 23 mmol/L (ref 20–29)
Calcium: 10.1 mg/dL (ref 8.7–10.2)
Chloride: 103 mmol/L (ref 96–106)
Creatinine, Ser: 1.27 mg/dL (ref 0.76–1.27)
Glucose: 93 mg/dL (ref 70–99)
Potassium: 3.9 mmol/L (ref 3.5–5.2)
Sodium: 142 mmol/L (ref 134–144)
eGFR: 66 mL/min/{1.73_m2} (ref >59)

## 2023-10-09 NOTE — Progress Notes (Signed)
 Internal Medicine Clinic Attending  I saw and evaluated the patient.  I personally confirmed the key portions of the history and exam documented by Dr.  Ninfa Meeker  and I reviewed pertinent patient test results.  The assessment, diagnosis, and plan were formulated together and I agree with the documentation in the resident's note.

## 2023-11-06 ENCOUNTER — Encounter: Payer: BC Managed Care – PPO | Admitting: Student

## 2023-11-10 ENCOUNTER — Ambulatory Visit: Payer: BC Managed Care – PPO | Admitting: Student

## 2023-11-10 VITALS — BP 135/87 | HR 65 | Temp 97.9°F | Ht 66.0 in | Wt 187.9 lb

## 2023-11-10 DIAGNOSIS — I10 Essential (primary) hypertension: Secondary | ICD-10-CM | POA: Diagnosis not present

## 2023-11-10 DIAGNOSIS — M79672 Pain in left foot: Secondary | ICD-10-CM

## 2023-11-10 DIAGNOSIS — R519 Headache, unspecified: Secondary | ICD-10-CM | POA: Insufficient documentation

## 2023-11-10 DIAGNOSIS — R5383 Other fatigue: Secondary | ICD-10-CM | POA: Diagnosis not present

## 2023-11-10 DIAGNOSIS — G44219 Episodic tension-type headache, not intractable: Secondary | ICD-10-CM | POA: Diagnosis not present

## 2023-11-10 DIAGNOSIS — M79671 Pain in right foot: Secondary | ICD-10-CM

## 2023-11-10 MED ORDER — ACETAMINOPHEN 500 MG PO TABS: 1000.0000 mg | ORAL_TABLET | Freq: Three times a day (TID) | ORAL | 2 refills | Status: AC | PRN

## 2023-11-10 MED ORDER — AMLODIPINE BESYLATE 5 MG PO TABS: 5.0000 mg | ORAL_TABLET | Freq: Every day | ORAL | 11 refills | Status: DC

## 2023-11-10 MED ORDER — LOSARTAN POTASSIUM-HCTZ 50-12.5 MG PO TABS: 1.0000 | ORAL_TABLET | Freq: Every day | ORAL | 3 refills | Status: DC

## 2023-11-10 NOTE — Assessment & Plan Note (Signed)
Patient states he is not getting good quality sleep at night and has been feeling fatigued for some time (months). Endorses snoring, waking up throughout the night, and headaches. BMI 30.33 kg/m2. Given symptoms and patient's history of elevated BP discussed suspected OSA and sleep study referral. Explained what that would entail and patient is agreeable.  Plan - Sleep study referral, follow up as needed

## 2023-11-10 NOTE — Assessment & Plan Note (Signed)
Patient's blood pressure 135/87 today, HR 65. Denies any syncope, dizziness, lightheadedness, chest pain, palpitations, changes in vision, or lower extremity swelling. Physical exam unremarkable. Does endorse an intermittent headache, not currently (see headache section for more information). With help from the [facility] interpreter, clarified patient is taking Hyzaar (losartan-hydrochlorothiazide 50-12.5 mg) once daily and amlodipine 5 mg once daily. Endorses taking both medications consistently since last [facility] visit. Of note, Hyzaar was not on patient's current medication list and prior note from 12/13 visit noted patient is taking Hyzaar BID. Discussed importance of bringing medications to appointments, provided patient with medication bag for easy transport. Added Hyzaar 50-12.5 mg daily back on medication list and provided refills today. Patient encouraged to continue taking medications consistently and to bring in medications to his next visit.  Plan - Continue Hyzaar (losartan-hydrochlorothiazide 50-12.5 mg) once daily - Continue Amlodipine 5 mg once daily  - 1 month follow-up for BP and medication management, adjust medications as needed

## 2023-11-10 NOTE — Assessment & Plan Note (Signed)
Patient continues to endorse intermittent headaches. States he used to treat them with [facility] or Goody's headache powders until about a month ago. Was having to take them 2-3 times a week. Patient was unaware some of these powders contain caffeine, which can impact his ability to sleep. Discussed risk of ibuprofen/NSAID use and risk of PUD. Discussed possible reasons for headache, including elevated BP (which is improved today), stress, poor quality sleep, and rebound headache. Patient agreeable to sleep study referral, which may help determine if OSA is contributing. Counseled on using Tylenol 650 mg-1000 mg q8H as needed for headache instead of headache powders or ibuprofen. Recommended keeping a headache journal as well.  Plan - Review headache journal with patient at 1 month follow up visit  - Tylenol 650 mg -1000 mg q8H as needed for headache

## 2023-11-10 NOTE — Progress Notes (Signed)
Established Patient Office Visit  Subjective   Patient ID: Cameron Allen, male    DOB: 22-Jul-1966  Age: 58 y.o. MRN: [id number]  Chief Complaint  Patient presents with   Hypertension    Follow up elevated BP (has taken meds this am )   Fatigue    Patient is a 58 y.o. with a past medical history stated below who presents today for follow-up for hypertension, headaches, and fatigue. He is accompanied by an in-person [place] interpreter. Please see problem based assessment and plan for additional details.     Past Medical History:  Diagnosis Date   Hypertension    Prediabetes    Review of Systems  Constitutional:        Fatigue  Eyes:  Negative for blurred vision.  Cardiovascular:  Negative for chest pain, palpitations and leg swelling.  Gastrointestinal:  Negative for blood in stool and constipation.  Genitourinary:  Negative for dysuria.     Objective:     BP 135/87 (BP Location: Left Arm, Patient Position: Sitting, Cuff Size: Normal)   Pulse 65   Temp 97.9 F (36.6 C) (Oral)   Ht 5\' 6"  (1.676 m)   Wt 187 lb 14.4 oz (85.2 kg)   SpO2 98%   BMI 30.33 kg/m    Physical Exam Constitutional:      Appearance: Normal appearance.  HENT:     Head: Normocephalic and atraumatic.     Nose: Nose normal.  Eyes:     Extraocular Movements: Extraocular movements intact.     Pupils: Pupils are equal, round, and reactive to light.  Cardiovascular:     Rate and Rhythm: Normal rate and regular rhythm.     Pulses: Normal pulses.  Pulmonary:     Effort: Pulmonary effort is normal.     Breath sounds: Normal breath sounds.  Abdominal:     Palpations: Abdomen is soft.     Tenderness: There is no abdominal tenderness.  Musculoskeletal:        General: No swelling.  Skin:    General: Skin is warm and dry.  Neurological:     General: No focal deficit present.     Mental Status: He is alert.  Psychiatric:        Mood and Affect: Mood normal.        Behavior: Behavior  normal.    No results found for any visits on 11/10/23.  Last metabolic panel Lab Results  Component Value Date   GLUCOSE 93 10/06/2023   NA 142 10/06/2023   K 3.9 10/06/2023   CL 103 10/06/2023   CO2 23 10/06/2023   BUN 15 10/06/2023   CREATININE 1.27 10/06/2023   EGFR 66 10/06/2023   CALCIUM 10.1 10/06/2023   PROT 6.9 02/20/2023   ALBUMIN 4.5 02/20/2023   LABGLOB 2.4 02/20/2023   AGRATIO 1.9 02/20/2023   BILITOT 0.2 02/20/2023   ALKPHOS 144 (H) 02/20/2023   AST 20 02/20/2023   ALT 25 02/20/2023   ANIONGAP 14 06/02/2019    The 10-year ASCVD risk score (Arnett DK, et al., 2019) is: 8.4%    Assessment & Plan:   Problem List Items Addressed This Visit     Hypertension - Primary   Patient's blood pressure 135/87 today, HR 65. Denies any syncope, dizziness, lightheadedness, chest pain, palpitations, changes in vision, or lower extremity swelling. Physical exam unremarkable. Does endorse an intermittent headache, not currently (see headache section for more information). With help from the [facility] interpreter, clarified patient  is taking Hyzaar (losartan-hydrochlorothiazide 50-12.5 mg) once daily and amlodipine 5 mg once daily. Endorses taking both medications consistently since last [facility] visit. Of note, Hyzaar was not on patient's current medication list and prior note from 12/13 visit noted patient is taking Hyzaar BID. Discussed importance of bringing medications to appointments, provided patient with medication bag for easy transport. Added Hyzaar 50-12.5 mg daily back on medication list and provided refills today. Patient encouraged to continue taking medications consistently and to bring in medications to his next visit.  Plan - Continue Hyzaar (losartan-hydrochlorothiazide 50-12.5 mg) once daily - Continue Amlodipine 5 mg once daily  - 1 month follow-up for BP and medication management, adjust medications as needed      Relevant Medications    losartan-hydrochlorothiazide (HYZAAR) 50-12.5 MG tablet   amLODipine (NORVASC) 5 MG tablet   Other Relevant Orders   Ambulatory referral to Sleep Studies   Episodic tension type headache   Patient continues to endorse intermittent headaches. States he used to treat them with [facility] or Goody's headache powders until about a month ago. Was having to take them 2-3 times a week. Patient was unaware some of these powders contain caffeine, which can impact his ability to sleep. Discussed risk of ibuprofen/NSAID use and risk of PUD. Discussed possible reasons for headache, including elevated BP (which is improved today), stress, poor quality sleep, and rebound headache. Patient agreeable to sleep study referral, which may help determine if OSA is contributing. Counseled on using Tylenol 650 mg-1000 mg q8H as needed for headache instead of headache powders or ibuprofen. Recommended keeping a headache journal as well.  Plan - Review headache journal with patient at 1 month follow up visit  - Tylenol 650 mg -1000 mg q8H as needed for headache      Relevant Medications   amLODipine (NORVASC) 5 MG tablet   acetaminophen (TYLENOL) 500 MG tablet   RESOLVED: Headache   Relevant Medications   amLODipine (NORVASC) 5 MG tablet   acetaminophen (TYLENOL) 500 MG tablet   Fatigue   Patient states he is not getting good quality sleep at night and has been feeling fatigued for some time (months). Endorses snoring, waking up throughout the night, and headaches. BMI 30.33 kg/m2. Given symptoms and patient's history of elevated BP discussed suspected OSA and sleep study referral. Explained what that would entail and patient is agreeable.  Plan - Sleep study referral, follow up as needed       Other Visit Diagnoses       Foot pain, bilateral       Relevant Medications   acetaminophen (TYLENOL) 500 MG tablet      Patient discussed with Dr. Mayford Knife.  Return in about 4 weeks (around 12/08/2023) for BP check,  mediaction management, sleep study follow up .   Albertine Lafoy Colbert Coyer, MD

## 2023-11-10 NOTE — Patient Instructions (Signed)
Thank you, [name] for allowing [place] to provide your care today. Today we discussed your blood pressure, headaches, and fatigue.   I have ordered the following labs for you:  Lab Orders  No laboratory test(s) ordered today     Tests ordered today:  None  Referrals ordered today:   Referral Orders  No referral(s) requested today     I have ordered the following medication/changed the following medications:   Stop the following medications: Medications Discontinued During This Encounter  Medication Reason   amLODipine (NORVASC) 5 MG tablet Reorder   acetaminophen (TYLENOL) 500 MG tablet Reorder   losartan-hydrochlorothiazide (HYZAAR) 50-12.5 MG tablet Reorder     Start the following medications: Meds ordered this encounter  Medications   losartan-hydrochlorothiazide (HYZAAR) 50-12.5 MG tablet    Sig: Take 1 tablet by mouth daily.    Dispense:  30 tablet    Refill:  3   amLODipine (NORVASC) 5 MG tablet    Sig: Take 1 tablet (5 mg total) by mouth daily.    Dispense:  30 tablet    Refill:  11   acetaminophen (TYLENOL) 500 MG tablet    Sig: Take 2 tablets (1,000 mg total) by mouth every 8 (eight) hours as needed. Take this to relieve pain.    Dispense:  100 tablet    Refill:  2     Follow up: 1 month for blood pressure check, medication management   Remember:   - Take your losartan hydrochlorothiazide (HYZAAR) pill once a day - Take your amlodipine 5 mg tablet once a day  - For headache: Take Tylenol 500 mg (2 pills for 1000 mg total), you can repeat another dose (2 pills for 1000 mg total) 6-8 hours later if you continue to have a headache - Keep a headache journal - Someone will call you to schedule the sleep study to help determine if you have sleep apnea - Please schedule an appointment with [place] in 1 month  Should you have any questions or concerns please call the internal medicine clinic at [phone].     Cameron Allen Coyer, MD PGY-1 Internal  Medicine Teaching [facility]

## 2023-11-16 NOTE — Progress Notes (Signed)
 Internal Medicine Clinic Attending  Case discussed with the resident at the time of the visit.  We reviewed the resident's history and exam and pertinent patient test results.  I agree with the assessment, diagnosis, and plan of care documented in the resident's note.

## 2023-12-15 ENCOUNTER — Ambulatory Visit: Payer: BC Managed Care – PPO | Admitting: Student

## 2023-12-15 ENCOUNTER — Other Ambulatory Visit: Payer: Self-pay

## 2023-12-15 VITALS — BP 133/82 | HR 62 | Temp 98.0°F | Ht 66.0 in | Wt 190.7 lb

## 2023-12-15 DIAGNOSIS — I10 Essential (primary) hypertension: Secondary | ICD-10-CM | POA: Diagnosis not present

## 2023-12-15 DIAGNOSIS — Z0000 Encounter for general adult medical examination without abnormal findings: Secondary | ICD-10-CM

## 2023-12-15 MED ORDER — AMLODIPINE BESYLATE 5 MG PO TABS: 5.0000 mg | ORAL_TABLET | Freq: Every day | ORAL | 11 refills | Status: DC

## 2023-12-15 MED ORDER — OLMESARTAN-AMLODIPINE-HCTZ 40-10-25 MG PO TABS: 1.0000 | ORAL_TABLET | Freq: Every day | ORAL | 3 refills | Status: DC

## 2023-12-15 NOTE — Assessment & Plan Note (Addendum)
 BP 133/82. At home readings average ~ 130-140's with highest in 150's. Prescribed Hyzaar 50-12.5 and Amlodipine 5 but patient has been taking Hyzaar BID as he states Amlodipine did not have refills (although 11 were ordered). Overall there has been a lot of confusion regarding medication management as he speaks Counselling psychologist (interpreter present). In order to simplify regimen, will discontinue above and start Tribenzor 40-10-25.  -Nurse BP check and cuff calibration in two weeks -BMP

## 2023-12-15 NOTE — Progress Notes (Signed)
 CC: Follow-up  HPI:  [name] is a 58 y.o. male living with a history stated below and presents today for follow-up. Please see problem based assessment and plan for additional details.  Past Medical History:  Diagnosis Date   Hypertension    Prediabetes     Current Outpatient Medications on File Prior to Visit  Medication Sig Dispense Refill   acetaminophen (TYLENOL) 500 MG tablet Take 2 tablets (1,000 mg total) by mouth every 8 (eight) hours as needed. Take this to relieve pain. 100 tablet 2   diclofenac Sodium (VOLTAREN) 1 % GEL Apply 4 g topically 4 (four) times daily. To hands and feet to relieve pain. 4 g 2   triamcinolone ointment (KENALOG) 0.1 % Apply 1 Application topically 2 (two) times daily. 30 g 1   No current facility-administered medications on file prior to visit.    No family history on file.  Social History   Socioeconomic History   Marital status: Married    Spouse name: Not on file   Number of children: Not on file   Years of education: Not on file   Highest education level: Not on file  Occupational History   Not on file  Tobacco Use   Smoking status: Never   Smokeless tobacco: Not on file  Substance and Sexual Activity   Alcohol use: No   Drug use: No   Sexual activity: Not on file  Other Topics Concern   Not on file  Social History Narrative   Not on file   Social Drivers of Health   Financial Resource Strain: Not on file  Food Insecurity: No Food Insecurity (12/15/2023)   Hunger Vital Sign    Worried About Running Out of Food in the Last Year: Never true    Ran Out of Food in the Last Year: Never true  Transportation Needs: No Transportation Needs (12/15/2023)   PRAPARE - Administrator, Civil Service (Medical): No    Lack of Transportation (Non-Medical): No  Physical Activity: Not on file  Stress: Not on file  Social Connections: Unknown (12/15/2023)   Social Connection and Isolation Panel [NHANES]    Frequency of  Communication with Friends and Family: More than three times a week    Frequency of Social Gatherings with Friends and Family: Not on file    Attends Religious Services: Not on file    Active Member of Clubs or Organizations: Not on file    Attends Banker Meetings: Not on file    Marital Status: Not on file  Intimate Partner Violence: Not At Risk (12/15/2023)   Humiliation, Afraid, Rape, and Kick questionnaire    Fear of Current or Ex-Partner: No    Emotionally Abused: No    Physically Abused: No    Sexually Abused: No    Review of Systems: ROS negative except for what is noted on the assessment and plan.  Vitals:   12/15/23 0922  BP: 133/82  Pulse: 62  Temp: 98 F (36.7 C)  TempSrc: Oral  SpO2: 100%  Weight: 190 lb 11.2 oz (86.5 kg)  Height: 5\' 6"  (1.676 m)    Physical Exam: Constitutional: well-appearing, sitting in chair, in no acute distress Cardiovascular: regular rate and rhythm, no m/r/g Pulmonary/Chest: normal work of breathing on room air, lungs clear to auscultation bilaterally Abdominal: soft, non-tender, non-distended MSK: normal bulk and tone Skin: warm and dry Psych: normal mood and behavior  Assessment & Plan:  Patient discussed with Dr. Cleda Daub  Hypertension BP 133/82. At home readings average ~ 130-140's with highest in 150's. Prescribed Hyzaar 50-12.5 and Amlodipine 5 but patient has been taking Hyzaar BID as he states Amlodipine did not have refills (although 11 were ordered). Overall there has been a lot of confusion regarding medication management as he speaks Counselling psychologist (interpreter present). In order to simplify regimen, will discontinue above and start Tribenzor 40-10-25.  -Nurse BP check and cuff calibration in two weeks -BMP  Healthcare maintenance Referral sent for colonoscopy   Carmina Miller, D.O. [facility] Medicine, PGY-1 Phone: [phone] Date 12/18/2023 Time 8:00 AM

## 2023-12-16 LAB — BASIC METABOLIC PANEL
BUN/Creatinine Ratio: 12 (ref 9–20)
BUN: 14 mg/dL (ref 6–24)
CO2: 24 mmol/L (ref 20–29)
Calcium: 9.5 mg/dL (ref 8.7–10.2)
Chloride: 103 mmol/L (ref 96–106)
Creatinine, Ser: 1.18 mg/dL (ref 0.76–1.27)
Glucose: 97 mg/dL (ref 70–99)
Potassium: 3.9 mmol/L (ref 3.5–5.2)
Sodium: 141 mmol/L (ref 134–144)
eGFR: 72 mL/min/{1.73_m2} (ref >59)

## 2023-12-18 NOTE — Assessment & Plan Note (Signed)
Referral sent for colonoscopy

## 2023-12-24 NOTE — Progress Notes (Signed)
 Internal Medicine Clinic Attending  Case discussed with the resident at the time of the visit.  We reviewed the resident's history and exam and pertinent patient test results.  I agree with the assessment, diagnosis, and plan of care documented in the resident's note.

## 2023-12-29 ENCOUNTER — Ambulatory Visit: Payer: BC Managed Care – PPO

## 2024-01-12 ENCOUNTER — Ambulatory Visit: Payer: Self-pay | Admitting: Student

## 2024-01-12 VITALS — BP 122/84 | HR 64 | Temp 97.7°F | Ht 66.0 in | Wt 190.4 lb

## 2024-01-12 DIAGNOSIS — R7303 Prediabetes: Secondary | ICD-10-CM | POA: Diagnosis not present

## 2024-01-12 DIAGNOSIS — M130 Polyarthritis, unspecified: Secondary | ICD-10-CM | POA: Diagnosis not present

## 2024-01-12 DIAGNOSIS — R748 Abnormal levels of other serum enzymes: Secondary | ICD-10-CM

## 2024-01-12 DIAGNOSIS — I10 Essential (primary) hypertension: Secondary | ICD-10-CM | POA: Diagnosis not present

## 2024-01-12 NOTE — Assessment & Plan Note (Signed)
 Last time A1c was checked was in May 2024, and at that time was 6.2%.  Has not been checked since, so we will check today.

## 2024-01-12 NOTE — Assessment & Plan Note (Signed)
 Alk phos elevated x 2 over the last few years.  Will recheck today.  His last CMP showed an alk phos of 144, AST/ALT was normal.  He does not look jaundiced, but is not having any abdominal pain.

## 2024-01-12 NOTE — Progress Notes (Signed)
 CC: Routine health maintenance  HPI:  [name] is a 58 y.o. male living with a history stated below and presents today for routine health maintenance. Please see problem based assessment and plan for additional details.  Past Medical History:  Diagnosis Date   Hypertension    Prediabetes     Current Outpatient Medications on File Prior to Visit  Medication Sig Dispense Refill   acetaminophen (TYLENOL) 500 MG tablet Take 2 tablets (1,000 mg total) by mouth every 8 (eight) hours as needed. Take this to relieve pain. 100 tablet 2   diclofenac Sodium (VOLTAREN) 1 % GEL Apply 4 g topically 4 (four) times daily. To hands and feet to relieve pain. 4 g 2   Olmesartan-amLODIPine-HCTZ 40-10-25 MG TABS Take 1 tablet by mouth daily. 90 tablet 3   triamcinolone ointment (KENALOG) 0.1 % Apply 1 Application topically 2 (two) times daily. 30 g 1   No current facility-administered medications on file prior to visit.    No family history on file.  Social History   Socioeconomic History   Marital status: Married    Spouse name: Not on file   Number of children: Not on file   Years of education: Not on file   Highest education level: Not on file  Occupational History   Not on file  Tobacco Use   Smoking status: Never   Smokeless tobacco: Not on file  Substance and Sexual Activity   Alcohol use: No   Drug use: No   Sexual activity: Not on file  Other Topics Concern   Not on file  Social History Narrative   Not on file   Social Drivers of Health   Financial Resource Strain: Not on file  Food Insecurity: No Food Insecurity (12/15/2023)   Hunger Vital Sign    Worried About Running Out of Food in the Last Year: Never true    Ran Out of Food in the Last Year: Never true  Transportation Needs: No Transportation Needs (12/15/2023)   PRAPARE - Administrator, Civil Service (Medical): No    Lack of Transportation (Non-Medical): No  Physical Activity: Not on file  Stress:  Not on file  Social Connections: Unknown (12/15/2023)   Social Connection and Isolation Panel [NHANES]    Frequency of Communication with Friends and Family: More than three times a week    Frequency of Social Gatherings with Friends and Family: Not on file    Attends Religious Services: Not on file    Active Member of Clubs or Organizations: Not on file    Attends Banker Meetings: Not on file    Marital Status: Not on file  Intimate Partner Violence: Not At Risk (12/15/2023)   Humiliation, Afraid, Rape, and Kick questionnaire    Fear of Current or Ex-Partner: No    Emotionally Abused: No    Physically Abused: No    Sexually Abused: No    Review of Systems: ROS negative except for what is noted on the assessment and plan.  Vitals:   01/12/24 0944 01/12/24 1008  BP: (!) 124/93 122/84  Pulse: 98 64  Temp: 97.7 F (36.5 C)   TempSrc: Oral   SpO2: 98%   Weight: 190 lb 6.4 oz (86.4 kg)   Height: 5\' 6"  (1.676 m)     Physical Exam: Constitutional: well-appearing male in no acute distress HENT: normocephalic atraumatic, mucous membranes moist Eyes: conjunctiva non-erythematous, no scleral icterus present Cardiovascular: regular rate and rhythm, no  m/r/g, +2 pulses in bilateral lower extremities.  Multiple areas of varicose veins scattered throughout lower extremities Pulmonary/Chest: normal work of breathing on room air, lungs clear to auscultation bilaterally Abdominal: soft, non-tender, non-distended   Assessment & Plan:   Hypertension On tribenzor 40 - 10 - 25, blood pressure today 122/84.  No changes.   Prediabetes Last time A1c was checked was in May 2024, and at that time was 6.2%.  Has not been checked since, so we will check today.  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  He has had chronic arthralgias of multiple sites, mostly in the knees and feet today.  The pain is adequately controlled with over-the-counter medications.  He  did undergo a rheumatologic workup with no explanation.  He works as a Designer, industrial/product, and is on his feet every single day.  The pain gets better with rest, and he describes it as an aching pain.  Overall, I do not think that this is a vascular cause as he has good pulses in his lower extremities and his symptoms do not line up with claudication.  I do not think this is secondary to diabetes, as his last A1c a year ago was 6.2%, and pain does not sound neuropathic.  Had uric acid level checked for gout however this was not limited to a single joint, and I do not see any erythema or joint swelling.  He does however have varicose veins scattered throughout his lower extremities, which could be the source of the pain.  I educated patient on raising his legs up and wearing compression stockings to work.  He is agreeable to try this.  Elevated alkaline phosphatase level Alk phos elevated x 2 over the last few years.  Will recheck today.  His last CMP showed an alk phos of 144, AST/ALT was normal.  He does not look jaundiced, but is not having any abdominal pain.   Patient discussed with Dr. Pauline Good Antavious Spanos, M.D. [facility] Internal Medicine, PGY-2 Pager: [phone] Date 01/12/2024 Time 10:40 AM

## 2024-01-12 NOTE — Assessment & Plan Note (Signed)
 Seems to be a chronic issue for him.  He has had chronic arthralgias of multiple sites, mostly in the knees and feet today.  The pain is adequately controlled with over-the-counter medications.  He did undergo a rheumatologic workup with no explanation.  He works as a Designer, industrial/product, and is on his feet every single day.  The pain gets better with rest, and he describes it as an aching pain.  Overall, I do not think that this is a vascular cause as he has good pulses in his lower extremities and his symptoms do not line up with claudication.  I do not think this is secondary to diabetes, as his last A1c a year ago was 6.2%, and pain does not sound neuropathic.  Had uric acid level checked for gout however this was not limited to a single joint, and I do not see any erythema or joint swelling.  He does however have varicose veins scattered throughout his lower extremities, which could be the source of the pain.  I educated patient on raising his legs up and wearing compression stockings to work.  He is agreeable to try this.

## 2024-01-12 NOTE — Assessment & Plan Note (Addendum)
 On tribenzor 40 - 10 - 25, blood pressure today 122/84.  No changes.

## 2024-01-12 NOTE — Patient Instructions (Signed)
 Thank you so much for coming to the clinic today!   For your pain, I recommend trying compression stockings. I think the pain is due to the veins on your legs. We are also checking some labs today as well.   If you have any questions please feel free to the call the clinic at anytime at [phone]. It was a pleasure seeing you!  Best, Dr. Thomasene Ripple

## 2024-01-13 LAB — CMP14 + ANION GAP
ALT: 65 IU/L — ABNORMAL HIGH (ref 0–44)
AST: 32 IU/L (ref 0–40)
Albumin: 4.5 g/dL (ref 3.8–4.9)
Alkaline Phosphatase: 99 IU/L (ref 44–121)
Anion Gap: 15 mmol/L (ref 10.0–18.0)
BUN/Creatinine Ratio: 14 (ref 9–20)
BUN: 15 mg/dL (ref 6–24)
Bilirubin Total: 0.3 mg/dL (ref 0.0–1.2)
CO2: 21 mmol/L (ref 20–29)
Calcium: 9.5 mg/dL (ref 8.7–10.2)
Chloride: 105 mmol/L (ref 96–106)
Creatinine, Ser: 1.11 mg/dL (ref 0.76–1.27)
Globulin, Total: 2.7 g/dL (ref 1.5–4.5)
Glucose: 153 mg/dL — ABNORMAL HIGH (ref 70–99)
Potassium: 3.6 mmol/L (ref 3.5–5.2)
Sodium: 141 mmol/L (ref 134–144)
Total Protein: 7.2 g/dL (ref 6.0–8.5)
eGFR: 77 mL/min/{1.73_m2} (ref >59)

## 2024-01-13 LAB — HEMOGLOBIN A1C
Est. average glucose Bld gHb Est-mCnc: 203 mg/dL
Hgb A1c MFr Bld: 8.7 % — ABNORMAL HIGH (ref 4.8–5.6)

## 2024-01-17 NOTE — Progress Notes (Signed)
 Internal Medicine Clinic Attending  Case discussed with the resident at the time of the visit.  We reviewed the resident's history and exam and pertinent patient test results.  I agree with the assessment, diagnosis, and plan of care documented in the resident's note. Pain seemed to localize less to the joints and more to the legs and feet generally, hence possibly related to LE varicosities, which would be exacerbated by being on his feet all day. Agree with trial of compression stocking at work and elevating legs when sitting/supine.

## 2024-05-01 ENCOUNTER — Encounter: Admitting: Student

## 2024-05-10 ENCOUNTER — Other Ambulatory Visit: Payer: Self-pay

## 2024-05-10 ENCOUNTER — Ambulatory Visit: Admitting: Student

## 2024-05-10 ENCOUNTER — Encounter: Payer: Self-pay | Admitting: Student

## 2024-05-10 VITALS — BP 121/92 | HR 57 | Temp 98.1°F | Resp 28 | Ht 66.0 in | Wt 184.0 lb

## 2024-05-10 DIAGNOSIS — I10 Essential (primary) hypertension: Secondary | ICD-10-CM | POA: Diagnosis not present

## 2024-05-10 DIAGNOSIS — Z7984 Long term (current) use of oral hypoglycemic drugs: Secondary | ICD-10-CM | POA: Diagnosis not present

## 2024-05-10 DIAGNOSIS — E119 Type 2 diabetes mellitus without complications: Secondary | ICD-10-CM

## 2024-05-10 DIAGNOSIS — R7303 Prediabetes: Secondary | ICD-10-CM

## 2024-05-10 LAB — POCT GLYCOSYLATED HEMOGLOBIN (HGB A1C): Hemoglobin A1C: 9.3 % — AB (ref 4.0–5.6)

## 2024-05-10 LAB — GLUCOSE, CAPILLARY: Glucose-Capillary: 257 mg/dL — ABNORMAL HIGH (ref 70–99)

## 2024-05-10 MED ORDER — RYBELSUS 7 MG PO TABS: 7.0000 mg | ORAL_TABLET | Freq: Every day | ORAL | 3 refills | Status: AC

## 2024-05-10 MED ORDER — OLMESARTAN-AMLODIPINE-HCTZ 40-10-25 MG PO TABS: 0.5000 | ORAL_TABLET | Freq: Every day | ORAL | 3 refills | Status: AC

## 2024-05-10 NOTE — Patient Instructions (Addendum)
 Thank you so much for coming to the clinic today!   Please pick up your medication, AND ONLY TAKE HALF OF IT We are also starting you on a medication called Rybelsus which should help with your sugar I would like to see you back in about a week  If you have any questions please feel free to the call the clinic at anytime at [phone]. It was a pleasure seeing you!  Best, Dr. Dalayza Zambrana

## 2024-05-11 NOTE — Progress Notes (Signed)
 CC: Chronic condition follow up  HPI:  [name] is a 58 y.o. male living with a history stated below and presents today for follow up regarding his chronic conditions. Please see problem based assessment and plan for additional details.  Past Medical History:  Diagnosis Date   Elevated alkaline phosphatase level 02/22/2023   Hypertension    Prediabetes     Current Outpatient Medications on File Prior to Visit  Medication Sig Dispense Refill   acetaminophen  (TYLENOL ) 500 MG tablet Take 2 tablets (1,000 mg total) by mouth every 8 (eight) hours as needed. Take this to relieve pain. 100 tablet 2   diclofenac  Sodium (VOLTAREN ) 1 % GEL Apply 4 g topically 4 (four) times daily. To hands and feet to relieve pain. 4 g 2   triamcinolone  ointment (KENALOG ) 0.1 % Apply 1 Application topically 2 (two) times daily. 30 g 1   No current facility-administered medications on file prior to visit.    History reviewed. No pertinent family history.  Social History   Socioeconomic History   Marital status: Married    Spouse name: Not on file   Number of children: Not on file   Years of education: Not on file   Highest education level: Not on file  Occupational History   Not on file  Tobacco Use   Smoking status: Never   Smokeless tobacco: Not on file  Substance and Sexual Activity   Alcohol use: No   Drug use: No   Sexual activity: Not on file  Other Topics Concern   Not on file  Social History Narrative   Not on file   Social Drivers of Health   Financial Resource Strain: Low Risk  (05/10/2024)   Overall Financial Resource Strain (CARDIA)    Difficulty of Paying Living Expenses: Not hard at all  Food Insecurity: No Food Insecurity (12/15/2023)   Hunger Vital Sign    Worried About Running Out of Food in the Last Year: Never true    Ran Out of Food in the Last Year: Never true  Transportation Needs: No Transportation Needs (12/15/2023)   PRAPARE - Scientist, research (physical sciences) (Medical): No    Lack of Transportation (Non-Medical): No  Physical Activity: Sufficiently Active (05/10/2024)   Exercise Vital Sign    Days of Exercise per Week: 4 days    Minutes of Exercise per Session: 150+ min  Stress: Not on file  Social Connections: Unknown (12/15/2023)   Social Connection and Isolation Panel    Frequency of Communication with Friends and Family: More than three times a week    Frequency of Social Gatherings with Friends and Family: Not on file    Attends Religious Services: Not on file    Active Member of Clubs or Organizations: Not on file    Attends Banker Meetings: Not on file    Marital Status: Not on file  Intimate Partner Violence: Not At Risk (12/15/2023)   Humiliation, Afraid, Rape, and Kick questionnaire    Fear of Current or Ex-Partner: No    Emotionally Abused: No    Physically Abused: No    Sexually Abused: No    Review of Systems: ROS negative except for what is noted on the assessment and plan.  Vitals:   05/10/24 1025 05/10/24 1029  BP: (!) 125/95 (!) 121/92  Pulse: (!) 59 (!) 57  Resp: (!) 28   Temp: 98.1 F (36.7 C)   TempSrc: Oral   SpO2:  99%   Weight: 184 lb (83.5 kg)   Height: 5' 6 (1.676 m)     Physical Exam: Constitutional: well-appearing male  in no acute distress Cardiovascular: regular rate and rhythm, no m/r/g Pulmonary/Chest: normal work of breathing on room air, lungs clear to auscultation bilaterally Abdominal: soft, non-tender, non-distended  Assessment & Plan:   Type 2 diabetes mellitus (HCC) Lab Results  Component Value Date   HGBA1C 9.3 (A) 05/10/2024   HGBA1C 8.7 (H) 01/12/2024   HGBA1C 6.2 (H) 02/20/2023   A1c elevated from 8.7 to 9.3, will initiate anti-hyperglycemic medication. He is not interested in an injection, so will start with rybelsus  7mg  daily and slowly titrate up. At next visit can discuss switching to injectable GLP-1 with addition of metformin.    Hypertension Vitals:   05/10/24 1025 05/10/24 1029  BP: (!) 125/95 (!) 121/92    Normal systolic, diastolic elevated in the 90 range. He did not take his medication today. He is on a combination pill with three different medications (Tribenzor), and without taking it his blood pressure is near normal. Will trial just half of his previous dose, and have instructed him to take half a pill.    Patient discussed with Dr. Winfrey  Ahmiya Abee, M.D. [facility] Medicine, PGY-3 Pager: [phone] Date 05/11/2024 Time 5:32 PM

## 2024-05-11 NOTE — Assessment & Plan Note (Signed)
 Lab Results  Component Value Date   HGBA1C 9.3 (A) 05/10/2024   HGBA1C 8.7 (H) 01/12/2024   HGBA1C 6.2 (H) 02/20/2023   A1c elevated from 8.7 to 9.3, will initiate anti-hyperglycemic medication. He is not interested in an injection, so will start with rybelsus  7mg  daily and slowly titrate up. At next visit can discuss switching to injectable GLP-1 with addition of metformin.

## 2024-05-11 NOTE — Assessment & Plan Note (Signed)
 Vitals:   05/10/24 1025 05/10/24 1029  BP: (!) 125/95 (!) 121/92    Normal systolic, diastolic elevated in the 90 range. He did not take his medication today. He is on a combination pill with three different medications (Tribenzor), and without taking it his blood pressure is near normal. Will trial just half of his previous dose, and have instructed him to take half a pill.

## 2024-05-13 NOTE — Progress Notes (Signed)
 Internal Medicine Clinic Attending  Case discussed with the resident at the time of the visit.  We reviewed the resident's history and exam and pertinent patient test results.  I agree with the assessment, diagnosis, and plan of care documented in the resident's note.

## 2024-05-24 ENCOUNTER — Ambulatory Visit

## 2024-05-24 ENCOUNTER — Ambulatory Visit (HOSPITAL_COMMUNITY)
Admission: RE | Admit: 2024-05-24 | Discharge: 2024-05-24 | Disposition: A | Discharge status: Home / Self Care | Source: Ambulatory Visit | Attending: Internal Medicine | Admitting: Internal Medicine

## 2024-05-24 VITALS — BP 112/77 | HR 65 | Temp 97.9°F | Ht 66.0 in | Wt 180.8 lb

## 2024-05-24 DIAGNOSIS — I10 Essential (primary) hypertension: Secondary | ICD-10-CM | POA: Diagnosis not present

## 2024-05-24 DIAGNOSIS — Z7984 Long term (current) use of oral hypoglycemic drugs: Secondary | ICD-10-CM

## 2024-05-24 DIAGNOSIS — R61 Generalized hyperhidrosis: Secondary | ICD-10-CM | POA: Diagnosis not present

## 2024-05-24 DIAGNOSIS — K921 Melena: Secondary | ICD-10-CM | POA: Diagnosis not present

## 2024-05-24 DIAGNOSIS — E119 Type 2 diabetes mellitus without complications: Secondary | ICD-10-CM

## 2024-05-24 MED ORDER — GABAPENTIN 400 MG PO CAPS: 400.0000 mg | ORAL_CAPSULE | Freq: Two times a day (BID) | ORAL | 0 refills | Status: DC

## 2024-05-24 NOTE — Progress Notes (Deleted)
 CC: Follow-up  HPI:  [name] is a 58 y.o. male living with a history stated below and presents today for follow-up. Please see problem based assessment and plan for additional details.  Past Medical History:  Diagnosis Date  . Elevated alkaline phosphatase level 02/22/2023  . Hypertension   . Prediabetes     Current Outpatient Medications on File Prior to Visit  Medication Sig Dispense Refill  . acetaminophen  (TYLENOL ) 500 MG tablet Take 2 tablets (1,000 mg total) by mouth every 8 (eight) hours as needed. Take this to relieve pain. 100 tablet 2  . diclofenac  Sodium (VOLTAREN ) 1 % GEL Apply 4 g topically 4 (four) times daily. To hands and feet to relieve pain. 4 g 2  . Olmesartan -amLODIPine -HCTZ 40-10-25 MG TABS Take 0.5 tablets by mouth daily. 90 tablet 3  . Semaglutide  (RYBELSUS ) 7 MG TABS Take 1 tablet (7 mg total) by mouth daily. 90 tablet 3  . triamcinolone  ointment (KENALOG ) 0.1 % Apply 1 Application topically 2 (two) times daily. 30 g 1   No current facility-administered medications on file prior to visit.    No family history on file.  Social History   Socioeconomic History  . Marital status: Married    Spouse name: Not on file  . Number of children: Not on file  . Years of education: Not on file  . Highest education level: Not on file  Occupational History  . Not on file  Tobacco Use  . Smoking status: Never  . Smokeless tobacco: Not on file  Substance and Sexual Activity  . Alcohol use: No  . Drug use: No  . Sexual activity: Not on file  Other Topics Concern  . Not on file  Social History Narrative  . Not on file   Social Drivers of Health   Financial Resource Strain: Low Risk  (05/10/2024)   Overall Financial Resource Strain (CARDIA)   . Difficulty of Paying Living Expenses: Not hard at all  Food Insecurity: No Food Insecurity (12/15/2023)   Hunger Vital Sign   . Worried About Programme researcher, broadcasting/film/video in the Last Year: Never true   . Ran Out of  Food in the Last Year: Never true  Transportation Needs: No Transportation Needs (12/15/2023)   PRAPARE - Transportation   . Lack of Transportation (Medical): No   . Lack of Transportation (Non-Medical): No  Physical Activity: Sufficiently Active (05/10/2024)   Exercise Vital Sign   . Days of Exercise per Week: 4 days   . Minutes of Exercise per Session: 150+ min  Stress: Not on file  Social Connections: Unknown (12/15/2023)   Social Connection and Isolation Panel   . Frequency of Communication with Friends and Family: More than three times a week   . Frequency of Social Gatherings with Friends and Family: Not on file   . Attends Religious Services: Not on file   . Active Member of Clubs or Organizations: Not on file   . Attends Banker Meetings: Not on file   . Marital Status: Not on file  Intimate Partner Violence: Not At Risk (12/15/2023)   Humiliation, Afraid, Rape, and Kick questionnaire   . Fear of Current or Ex-Partner: No   . Emotionally Abused: No   . Physically Abused: No   . Sexually Abused: No    Review of Systems: ROS   There were no vitals filed for this visit.  Physical Exam: Physical Exam   Assessment & Plan:  Patient {[id number]::discussed with,seen with} Dr. {[id number]::Tpoopjfd,Z. Hoffman,Mullen,Narendra,Vincent,Guilloud,Lau,Machen}  Assessment & Plan    No orders of the defined types were placed in this encounter.    Rebecka Pion, D.O. [facility] Internal Medicine, PGY-1 Date 05/24/2024 Time 8:11 AM

## 2024-05-24 NOTE — Patient Instructions (Signed)
 You were seen for follow-up visit today.  Please follow the instructions discussed and the plan: --Continue taking your blood pressure medication: Half pill daily --Continue taking your Rybelsus  as prescribed daily --Colonoscopy referral sent.  GI will contact you --Gabapentin 400 mg ordered.  Take it 2 times a day for 7 days for left leg pain. -- Come back to clinic in 3 weeks to discuss other injectable options for diabetic medications.

## 2024-05-24 NOTE — Progress Notes (Signed)
 CC: Follow-up  HPI:  [name] is a 58 y.o. male living with a history stated below and presents today for follow-up of hypertension and diabetes.  Patient's blood pressure medication Tribenzor was changed from 1 pill to half pill as his blood pressure was under control. Patient was also started on Rybelsus  during his last visit and he said he is tolerating it well overall.  Patient did mention change in his bowel movements.  He said 2 days after starting Rybelsus  he felt constipated.  He said that he still has a bowel movement every day however stools seem to be harder. Patient denies any diarrhea nausea or vomiting. He denies any GI conditions in the past.  He also mention noticing bright red blood in his stool for a day or 2.  He said was a lot of blood on the paper towel and in the toilet.  Patient said  this has never happened with him before.  He also endorsed night sweats and occasional fevers for the past year. He has not lost any weight however has gained 20 pounds going from 170 pounds to 190 pounds this year.  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  He denies any bulging in the area when he bends down or even normally.  He has had his appendix taken off.  Patient also complains about burning sensation in his left shin area.  He has been using compression socks and elevation instructions that were previously given to him.  Past Medical History:  Diagnosis Date   Elevated alkaline phosphatase level 02/22/2023   Hypertension    Prediabetes     Current Outpatient Medications on File Prior to Visit  Medication Sig Dispense Refill   acetaminophen  (TYLENOL ) 500 MG tablet Take 2 tablets (1,000 mg total) by mouth every 8 (eight) hours as needed. Take this to relieve pain. 100 tablet 2   diclofenac  Sodium (VOLTAREN ) 1 % GEL Apply 4 g  topically 4 (four) times daily. To hands and feet to relieve pain. 4 g 2   Olmesartan -amLODIPine -HCTZ 40-10-25 MG TABS Take 0.5 tablets by mouth daily. 90 tablet 3   Semaglutide  (RYBELSUS ) 7 MG TABS Take 1 tablet (7 mg total) by mouth daily. 90 tablet 3   triamcinolone  ointment (KENALOG ) 0.1 % Apply 1 Application topically 2 (two) times daily. 30 g 1   No current facility-administered medications on file prior to visit.    No family history on file.  Social History   Socioeconomic History   Marital status: Married    Spouse name: Not on file   Number of children: Not on file   Years of education: Not on file   Highest education level: Not on file  Occupational History   Not on file  Tobacco Use   Smoking status: Never   Smokeless tobacco: Not on file  Substance and Sexual Activity   Alcohol use: No   Drug use: No   Sexual activity: Not on file  Other Topics Concern   Not on file  Social History Narrative   Not on file   Social Drivers of Health   Financial Resource Strain: Low Risk  (05/10/2024)   Overall Financial Resource Strain (CARDIA)    Difficulty of Paying Living Expenses: Not hard at all  Food Insecurity: No Food Insecurity (12/15/2023)   Hunger Vital  Sign    Worried About Programme researcher, broadcasting/film/video in the Last Year: Never true    Ran Out of Food in the Last Year: Never true  Transportation Needs: No Transportation Needs (12/15/2023)   PRAPARE - Administrator, Civil Service (Medical): No    Lack of Transportation (Non-Medical): No  Physical Activity: Sufficiently Active (05/10/2024)   Exercise Vital Sign    Days of Exercise per Week: 4 days    Minutes of Exercise per Session: 150+ min  Stress: Not on file  Social Connections: Unknown (12/15/2023)   Social Connection and Isolation Panel    Frequency of Communication with Friends and Family: More than three times a week    Frequency of Social Gatherings with Friends and Family: Not on file    Attends  Religious Services: Not on file    Active Member of Clubs or Organizations: Not on file    Attends Banker Meetings: Not on file    Marital Status: Not on file  Intimate Partner Violence: Not At Risk (12/15/2023)   Humiliation, Afraid, Rape, and Kick questionnaire    Fear of Current or Ex-Partner: No    Emotionally Abused: No    Physically Abused: No    Sexually Abused: No    Review of Systems: ROS  All pertinent review of systems in HPI and plan Vitals:   05/24/24 0838  BP: 112/77  Pulse: 65  Temp: 97.9 F (36.6 C)  TempSrc: Oral  SpO2: 97%  Weight: 180 lb 12.8 oz (82 kg)  Height: 5' 6 (1.676 m)    Physical Exam: Physical Exam HENT:     Head: Normocephalic.  Neck:     Comments: No palpable cervical or supraclavicular lymph nodes Cardiovascular:     Rate and Rhythm: Normal rate and regular rhythm.  Pulmonary:     Effort: Pulmonary effort is normal.     Breath sounds: Normal breath sounds.  Abdominal:     Tenderness: There is no abdominal tenderness. There is no guarding.     Hernia: No hernia is present.  Neurological:     Mental Status: He is alert.  Psychiatric:        Mood and Affect: Mood normal.      Assessment & Plan:     Patient seen with Dr. Shawn  Assessment & Plan Type 2 diabetes mellitus without complication, without long-term current use of insulin (HCC) Recent A1c was 9.3 which was elevated from his previous results.  Patient initially denied opting for an injectable GLP-1 agonist however said he can discuss about it in his next visit. Asked him to continue with Rybelsus  and follow-up in 3 months for A1c recheck.  Patient also has burning sensation in his shin which is likely due to neuropathic pain from diabetes.  Will try gabapentin  for this. --Continue home Rybelsus  7 mg tablet daily --Recheck A1c in 3 months --Talk about injectable GLP-1 agonists during next visit along with addition of metformin --Referral for early diabetic  eye exam placed --Prescribed short trial of gabapentin  Primary hypertension His blood pressure was well-controlled at 112/77.  Asked him to continue taking half pill of Tribenzor. --Continue half pill of Tribenzor --Will get CMP today Hematochezia Patient's recent changes in bowel movement constipation along with bright red blood in stool was concerning for colon cancer or any other GI abnormalities.  Patient to benefit from a colonoscopy.  Plan: --Colonoscopy referral placed --TSH levels will be checked today for constipation --Will  check CBC due to concerns of bleeding --Patient to take OTC MiraLAX for constipation Night sweats Patient's B symptoms of night sweats and occasional fevers are concerning to us  for malignancy or a lung pathology.  We would initially like to work him up with a chest x-ray and will decide on further management later. --Chest x-ray ordered       Rebecka Pion, D.O. [facility] Medicine, PGY-1 Date 05/24/2024 Time 8:56 AM

## 2024-05-25 LAB — CBC WITH DIFFERENTIAL/PLATELET
Basophils Absolute: 0.1 x10E3/uL (ref 0.0–0.2)
Basos: 1 %
EOS (ABSOLUTE): 0.1 x10E3/uL (ref 0.0–0.4)
Eos: 2 %
Hematocrit: 46.8 % (ref 37.5–51.0)
Hemoglobin: 14.5 g/dL (ref 13.0–17.7)
Immature Grans (Abs): 0.1 x10E3/uL (ref 0.0–0.1)
Immature Granulocytes: 1 %
Lymphocytes Absolute: 3.1 x10E3/uL (ref 0.7–3.1)
Lymphs: 37 %
MCH: 25.5 pg — ABNORMAL LOW (ref 26.6–33.0)
MCHC: 31 g/dL — ABNORMAL LOW (ref 31.5–35.7)
MCV: 82 fL (ref 79–97)
Monocytes Absolute: 0.8 x10E3/uL (ref 0.1–0.9)
Monocytes: 10 %
Neutrophils Absolute: 4.2 x10E3/uL (ref 1.4–7.0)
Neutrophils: 49 %
Platelets: 222 x10E3/uL (ref 150–450)
RBC: 5.68 x10E6/uL (ref 4.14–5.80)
RDW: 13.7 % (ref 11.6–15.4)
WBC: 8.3 x10E3/uL (ref 3.4–10.8)

## 2024-05-25 LAB — COMPREHENSIVE METABOLIC PANEL WITH GFR
ALT: 46 IU/L — ABNORMAL HIGH (ref 0–44)
AST: 28 IU/L (ref 0–40)
Albumin: 4.8 g/dL (ref 3.8–4.9)
Alkaline Phosphatase: 93 IU/L (ref 44–121)
BUN/Creatinine Ratio: 21 — ABNORMAL HIGH (ref 9–20)
BUN: 27 mg/dL — ABNORMAL HIGH (ref 6–24)
Bilirubin Total: 0.3 mg/dL (ref 0.0–1.2)
CO2: 15 mmol/L — ABNORMAL LOW (ref 20–29)
Calcium: 10.2 mg/dL (ref 8.7–10.2)
Chloride: 103 mmol/L (ref 96–106)
Creatinine, Ser: 1.27 mg/dL (ref 0.76–1.27)
Globulin, Total: 2.7 g/dL (ref 1.5–4.5)
Glucose: 111 mg/dL — ABNORMAL HIGH (ref 70–99)
Potassium: 3.5 mmol/L (ref 3.5–5.2)
Sodium: 142 mmol/L (ref 134–144)
Total Protein: 7.5 g/dL (ref 6.0–8.5)
eGFR: 66 mL/min/1.73 (ref >59)

## 2024-05-25 LAB — TSH: TSH: 0.871 u[IU]/mL (ref 0.450–4.500)

## 2024-05-25 LAB — MICROALBUMIN / CREATININE URINE RATIO
Creatinine, Urine: 118.5 mg/dL
Microalb/Creat Ratio: 5 mg/g{creat} (ref 0–29)
Microalbumin, Urine: 6.3 ug/mL

## 2024-05-28 NOTE — Assessment & Plan Note (Signed)
 Patient's recent changes in bowel movement constipation along with bright red blood in stool was concerning for colon cancer or any other GI abnormalities.  Patient to benefit from a colonoscopy.  Plan: --Colonoscopy referral placed --TSH levels will be checked today for constipation --Will check CBC due to concerns of bleeding --Patient to take OTC MiraLAX for constipation

## 2024-05-28 NOTE — Assessment & Plan Note (Signed)
 His blood pressure was well-controlled at 112/77.  Asked him to continue taking half pill of Tribenzor. --Continue half pill of Tribenzor --Will get CMP today

## 2024-05-28 NOTE — Assessment & Plan Note (Signed)
 Recent A1c was 9.3 which was elevated from his previous results.  Patient initially denied opting for an injectable GLP-1 agonist however said he can discuss about it in his next visit. Asked him to continue with Rybelsus  and follow-up in 3 months for A1c recheck.  Patient also has burning sensation in his shin which is likely due to neuropathic pain from diabetes.  Will try gabapentin  for this. --Continue home Rybelsus  7 mg tablet daily --Recheck A1c in 3 months --Talk about injectable GLP-1 agonists during next visit along with addition of metformin --Referral for early diabetic eye exam placed --Prescribed short trial of gabapentin 

## 2024-05-28 NOTE — Assessment & Plan Note (Signed)
 Patient's B symptoms of night sweats and occasional fevers are concerning to us  for malignancy or a lung pathology.  We would initially like to work him up with a chest x-ray and will decide on further management later. --Chest x-ray ordered

## 2024-05-29 NOTE — Addendum Note (Signed)
 Addended by: Hanako Tipping on: 05/29/2024 02:11 PM   Modules accepted: Level of Service

## 2024-05-29 NOTE — Progress Notes (Signed)
 Internal Medicine Clinic Attending  I was physically present during the key portions of the resident provided service and participated in the medical decision making of patient's management care. I reviewed pertinent patient test results.  The assessment, diagnosis, and plan were formulated together and I agree with the documentation in the resident's note.   68M PMHx HTN, uncontrolled T2DM presenting for persistent burning shin pain and new abdominal pain, hematochezia. History as below. Abdominal exam benign, no TTP in all 4 quadrants, no appreciable masses. Rectal exam deferred. Suspect constipation secondary to semaglutide  exacerbating hemorrhoidal bleeding however would recommend screening colonoscopy given age. Recommend miralax, ample hydration. Unclear what to make of night sweats, fevers (~1 year). No localizing symptoms aside from those described above. Will attempt to r/o occult infection,  - check labs, including TSH, BMP, CBC w/ diff  - CXR to evaluate for occult lung pathology (e.g. cavitary lesion, abscess, LAD) - recommend age-appropriate cancer screening: colonoscopy Rest as below.  Jone Dauphin MD

## 2024-06-13 ENCOUNTER — Ambulatory Visit: Admitting: Student

## 2024-06-13 ENCOUNTER — Ambulatory Visit: Admitting: Dietician

## 2024-06-13 VITALS — BP 136/90 | HR 64 | Temp 97.8°F | Ht 66.0 in | Wt 178.4 lb

## 2024-06-13 DIAGNOSIS — Z7984 Long term (current) use of oral hypoglycemic drugs: Secondary | ICD-10-CM | POA: Diagnosis not present

## 2024-06-13 DIAGNOSIS — I10 Essential (primary) hypertension: Secondary | ICD-10-CM

## 2024-06-13 DIAGNOSIS — E119 Type 2 diabetes mellitus without complications: Secondary | ICD-10-CM

## 2024-06-13 LAB — HM DIABETES EYE EXAM

## 2024-06-13 MED ORDER — GABAPENTIN 400 MG PO CAPS: 400.0000 mg | ORAL_CAPSULE | Freq: Two times a day (BID) | ORAL | 3 refills | Status: AC

## 2024-06-13 MED ORDER — METFORMIN HCL ER 500 MG PO TB24: 500.0000 mg | ORAL_TABLET | Freq: Every day | ORAL | 0 refills | Status: DC

## 2024-06-13 NOTE — Progress Notes (Signed)
 CC: Follow-up  HPI:  [name] is a 58 y.o. male living with a history stated below and presents today for follow-up. Please see problem based assessment and plan for additional details.  Past Medical History:  Diagnosis Date   Elevated alkaline phosphatase level 02/22/2023   Hypertension    Prediabetes     Current Outpatient Medications on File Prior to Visit  Medication Sig Dispense Refill   acetaminophen  (TYLENOL ) 500 MG tablet Take 2 tablets (1,000 mg total) by mouth every 8 (eight) hours as needed. Take this to relieve pain. 100 tablet 2   diclofenac  Sodium (VOLTAREN ) 1 % GEL Apply 4 g topically 4 (four) times daily. To hands and feet to relieve pain. 4 g 2   Olmesartan -amLODIPine -HCTZ 40-10-25 MG TABS Take 0.5 tablets by mouth daily. 90 tablet 3   Semaglutide  (RYBELSUS ) 7 MG TABS Take 1 tablet (7 mg total) by mouth daily. 90 tablet 3   triamcinolone  ointment (KENALOG ) 0.1 % Apply 1 Application topically 2 (two) times daily. 30 g 1   No current facility-administered medications on file prior to visit.    No family history on file.  Social History   Socioeconomic History   Marital status: Married    Spouse name: Not on file   Number of children: Not on file   Years of education: Not on file   Highest education level: Not on file  Occupational History   Not on file  Tobacco Use   Smoking status: Never   Smokeless tobacco: Not on file  Substance and Sexual Activity   Alcohol use: No   Drug use: No   Sexual activity: Not on file  Other Topics Concern   Not on file  Social History Narrative   Not on file   Social Drivers of Health   Financial Resource Strain: Low Risk  (05/10/2024)   Overall Financial Resource Strain (CARDIA)    Difficulty of Paying Living Expenses: Not hard at all  Food Insecurity: No Food Insecurity (12/15/2023)   Hunger Vital Sign    Worried About Running Out of Food in the Last Year: Never true    Ran Out of Food in the Last Year:  Never true  Transportation Needs: No Transportation Needs (12/15/2023)   PRAPARE - Administrator, Civil Service (Medical): No    Lack of Transportation (Non-Medical): No  Physical Activity: Sufficiently Active (05/10/2024)   Exercise Vital Sign    Days of Exercise per Week: 4 days    Minutes of Exercise per Session: 150+ min  Stress: Stress Concern Present (05/24/2024)   [facility] [employer] - Occupational Stress Questionnaire    Feeling of Stress: To some extent  Social Connections: Unknown (12/15/2023)   Social Connection and Isolation Panel    Frequency of Communication with Friends and Family: More than three times a week    Frequency of Social Gatherings with Friends and Family: Not on file    Attends Religious Services: Not on file    Active Member of Clubs or Organizations: Not on file    Attends Banker Meetings: Not on file    Marital Status: Not on file  Intimate Partner Violence: Not At Risk (12/15/2023)   Humiliation, Afraid, Rape, and Kick questionnaire    Fear of Current or Ex-Partner: No    Emotionally Abused: No    Physically Abused: No    Sexually Abused: No    Review of Systems: ROS negative except for  what is noted on the assessment and plan.  Vitals:   06/13/24 0820  BP: (!) 136/90  Pulse: 64  Temp: 97.8 F (36.6 C)  TempSrc: Oral  SpO2: 99%  Weight: 178 lb 6.4 oz (80.9 kg)  Height: 5' 6 (1.676 m)    Physical Exam: Constitutional: well-appearing, sitting in chair, in no acute distress Cardiovascular: regular rate and rhythm, no m/r/g Pulmonary/Chest: normal work of breathing on room air, lungs clear to auscultation bilaterally Abdominal: distended but soft, non-tender MSK: normal bulk and tone Skin: warm and dry Psych: normal mood and behavior  Assessment & Plan:     Patient discussed with Dr. Karna  Type 2 diabetes mellitus (HCC) 7/18 A1c of 9.3. Managed with Rybelsus  7 mg daily. After 7/18 he has  made a lot of lifestyle changes specifically with cutting out fast foot. He is also very active with work. Will start Glucophage -XR 500 daily today. He follow-up 8/21 with plan to continue or increase meds based on A1c. Neuropathy has improved with Gabapentin  which was started last OV.   Hypertension BP is 136/90.  He does not check BP at home but will pick up cuff in the coming days.  Managed with Tribenzor 1/2 tablet of 40-10-25 mg daily. Normotensive last visit with this regimen so will advise ambulatory readings for now, BP log given and instructions to bring it to follow-up.   Cameron Allen, D.O. [facility] Internal Medicine, PGY-2 Phone: [phone] Date 06/15/2024 Time 7:35 PM

## 2024-06-13 NOTE — Progress Notes (Signed)
 Retinal images were done today as part of this patient's yearly diabetes health maintenance program per Dr. Francesca request. They were transmitted electronically to an ophthalmologist who will interpret them and send a report back with their findings. This was explained to the patient using a translator in person today and they were also informed that the results would be communicated to them either by phone,  mail or both.  Arland Hole, RD 06/13/2024 9:41 AM.

## 2024-06-15 NOTE — Assessment & Plan Note (Signed)
 BP is 136/90.  He does not check BP at home but will pick up cuff in the coming days.  Managed with Tribenzor 1/2 tablet of 40-10-25 mg daily. Normotensive last visit with this regimen so will advise ambulatory readings for now, BP log given and instructions to bring it to follow-up.

## 2024-06-15 NOTE — Assessment & Plan Note (Signed)
 7/18 A1c of 9.3. Managed with Rybelsus  7 mg daily. After 7/18 he has made a lot of lifestyle changes specifically with cutting out fast foot. He is also very active with work. Will start Glucophage -XR 500 daily today. He follow-up 8/21 with plan to continue or increase meds based on A1c. Neuropathy has improved with Gabapentin  which was started last OV.

## 2024-06-17 NOTE — Progress Notes (Signed)
 Internal Medicine Clinic Attending  Case discussed with the resident at the time of the visit.  We reviewed the resident's history and exam and pertinent patient test results.  I agree with the assessment, diagnosis, and plan of care documented in the resident's note.

## 2024-06-19 NOTE — Progress Notes (Signed)
Retinal results were abstracted into patient's chart. See Ophthalmology section of Health Maintenance section of his chart for details

## 2024-06-20 ENCOUNTER — Ambulatory Visit: Payer: Self-pay

## 2024-06-28 ENCOUNTER — Encounter: Admitting: Student

## 2024-07-05 ENCOUNTER — Ambulatory Visit: Admitting: Student

## 2024-07-05 VITALS — BP 131/87 | HR 66 | Temp 97.7°F | Ht 66.0 in | Wt 180.4 lb

## 2024-07-05 DIAGNOSIS — M130 Polyarthritis, unspecified: Secondary | ICD-10-CM | POA: Diagnosis not present

## 2024-07-05 DIAGNOSIS — M79672 Pain in left foot: Secondary | ICD-10-CM

## 2024-07-05 DIAGNOSIS — M79671 Pain in right foot: Secondary | ICD-10-CM | POA: Diagnosis not present

## 2024-07-05 MED ORDER — DICLOFENAC SODIUM 1 % EX GEL: 4.0000 g | Freq: Four times a day (QID) | CUTANEOUS | 2 refills | Status: AC

## 2024-07-05 NOTE — Patient Instructions (Signed)
 Thank you, Mr.Lleyton Sanford for allowing us  to provide your care today. Today we discussed:  - I ordered X ray of your Left Knee   New medications: - Voltaren  Gel   I have ordered the following labs for you:  Lab Orders  No laboratory test(s) ordered today     I will call if any are abnormal. All of your labs can be accessed through My Chart.   My Chart Access: [email or web address]  Please follow-up in:    We look forward to seeing you next time. Please call our clinic at [phone] if you have any questions or concerns. The best time to call is Monday-Friday from 9am-4pm, but there is someone available 24/7. If after hours or the weekend, call the main hospital number and ask for the Internal Medicine Resident On-Call. If you need medication refills, please notify your pharmacy one week in advance and they will send us  a request.   Thank you for letting us  take part in your care. Wishing you the best!  Heddy Barren, DO 07/05/2024, 11:19 AM [facility]

## 2024-07-05 NOTE — Progress Notes (Signed)
   Established Patient Office Visit  Subjective   Patient ID: Cameron Allen, male    DOB: [birth year]  Age: 59 y.o. MRN: [id number]  Chief Complaint  Patient presents with   Leg Pain    Left leg pain and swelling x4-5 days  Pain behind knee      Cameron Allen is a 58 y.o. male with past medical history of type 2 diabetes, hypertension who presents today for discussion about lower extremity pain.  Review of Systems:  As per assessment and Plan   Objective:     Vitals:   07/05/24 0919  BP: 131/87  Pulse: 66  Temp: 97.7 F (36.5 C)  TempSrc: Oral  SpO2: 97%  Weight: 180 lb 6.4 oz (81.8 kg)  Height: 5' 6 (1.676 m)    Physical Exam General: Sitting in chair, no acute distress Cardiovascular: Regular rate Pulmonary: Breathing comfortably Abdomen: Soft, nontender, nondistended MSK: ROM intact.  TTP left popliteal area, no edema or erythema of the bilateral knees.  Does have varicose veins as present.  Last hemoglobin A1c Lab Results  Component Value Date   HGBA1C 9.3 (A) 05/10/2024    The 10-year ASCVD risk score (Arnett DK, et al., 2019) is: 12.7%    Assessment & Plan:   Patient discussed with Dr. Francesco  Problem List Items Addressed This Visit       Musculoskeletal and Integument   Symmetrical polyarthritis - Primary   Patient presents today to discuss left lower extremity pain, reports started about 4 days ago, primary localized to left popliteal area and anterior shin.  States that it gets worse with walking that is affecting his overall gait.  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  Patient reports that sometimes he notes swelling of his bilateral feet, and the pain improves with rest.  On exam, no lower extremity edema is noted, no redness or swelling of the bilateral knees is noted, there is some tenderness to palpation along the left popliteal area, no swelling.  Range of motion is intact.  Per chart  review, it appears that he has chronic polyarthritis, and had prior rheumatologic workup that was within normal limits. -Recommended supportive care and conservative treatment at this time - Will get x-ray of left knee      Relevant Medications   diclofenac  Sodium (VOLTAREN ) 1 % GEL   Other Relevant Orders   DG Knee Complete 4 Views Left   Other Visit Diagnoses       Foot pain, bilateral       Relevant Medications   diclofenac  Sodium (VOLTAREN ) 1 % GEL       No follow-ups on file.    Toma Edwards, DO

## 2024-07-09 ENCOUNTER — Other Ambulatory Visit: Payer: Self-pay | Admitting: Student

## 2024-07-10 ENCOUNTER — Telehealth: Payer: Self-pay | Admitting: Student

## 2024-07-10 NOTE — Telephone Encounter (Signed)
 This pt is overdue for a Mammogram.  Place a new order and we will get the patient sch.

## 2024-07-10 NOTE — Telephone Encounter (Unsigned)
 Copied from CRM [id number]. Topic: Clinical - Medication Refill >> Jul 10, 2024 10:10 AM Carrielelia G wrote: Medication:   gabapentin  (NEURONTIN ) 400 MG capsule Olmesartan -amLODIPine -HCTZ 40-10-25 MG TABS    Has the patient contacted their pharmacy? No (Agent: If no, request that the patient contact the pharmacy for the refill. If patient does not wish to contact the pharmacy document the reason why and proceed with request.) (Agent: If yes, when and what did the pharmacy advise?)  This is the patient's preferred pharmacy:  [facility] [phone] - [place], [place] - [address] AT [facility] [address] [place] [address] Phone: [phone] Fax: [phone]  Is this the correct pharmacy for this prescription? Yes If no, delete pharmacy and type the correct one.      Has the patient been seen for an appointment in the last year OR does the patient have an upcoming appointment? Yes  Can we respond through MyChart? No  Agent: Please be advised that Rx refills may take up to 3 business days. We ask that you follow-up with your pharmacy.

## 2024-07-10 NOTE — Telephone Encounter (Signed)
 Medication last refilled 06/13/24 with 90 tablets.

## 2024-07-10 NOTE — Assessment & Plan Note (Signed)
 Patient presents today to discuss left lower extremity pain, reports started about 4 days ago, primary localized to left popliteal area and anterior shin.  States that it gets worse with walking that is affecting his overall gait.  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  Patient reports that sometimes he notes swelling of his bilateral feet, and the pain improves with rest.  On exam, no lower extremity edema is noted, no redness or swelling of the bilateral knees is noted, there is some tenderness to palpation along the left popliteal area, no swelling.  Range of motion is intact.  Per chart review, it appears that he has chronic polyarthritis, and had prior rheumatologic workup that was within normal limits. -Recommended supportive care and conservative treatment at this time - Will get x-ray of left knee

## 2024-07-11 NOTE — Progress Notes (Signed)
 Internal Medicine Clinic Attending  Case discussed with the resident at the time of the visit.  We reviewed the resident's history and exam and pertinent patient test results.  I agree with the assessment, diagnosis, and plan of care documented in the resident's note.

## 2024-07-14 ENCOUNTER — Other Ambulatory Visit: Payer: Self-pay | Admitting: Student

## 2024-07-15 NOTE — Telephone Encounter (Signed)
 Duplicate med refill request, medication was last refilled 06/13/24 with 90 tablets.

## 2024-07-22 ENCOUNTER — Other Ambulatory Visit: Payer: Self-pay

## 2024-07-22 DIAGNOSIS — Z1231 Encounter for screening mammogram for malignant neoplasm of breast: Secondary | ICD-10-CM

## 2024-09-13 ENCOUNTER — Ambulatory Visit: Admitting: Student

## 2024-10-10 ENCOUNTER — Other Ambulatory Visit: Payer: Self-pay | Admitting: Student

## 2024-10-10 DIAGNOSIS — I10 Essential (primary) hypertension: Secondary | ICD-10-CM

## 2024-10-11 ENCOUNTER — Ambulatory Visit: Payer: Self-pay

## 2024-10-11 VITALS — BP 132/90 | HR 71 | Temp 97.7°F | Ht 66.0 in | Wt 180.2 lb

## 2024-10-11 DIAGNOSIS — Z0000 Encounter for general adult medical examination without abnormal findings: Secondary | ICD-10-CM

## 2024-10-11 DIAGNOSIS — E119 Type 2 diabetes mellitus without complications: Secondary | ICD-10-CM | POA: Diagnosis not present

## 2024-10-11 DIAGNOSIS — M25562 Pain in left knee: Secondary | ICD-10-CM

## 2024-10-11 DIAGNOSIS — G8929 Other chronic pain: Secondary | ICD-10-CM

## 2024-10-11 DIAGNOSIS — Z1211 Encounter for screening for malignant neoplasm of colon: Secondary | ICD-10-CM | POA: Diagnosis not present

## 2024-10-11 DIAGNOSIS — Z23 Encounter for immunization: Secondary | ICD-10-CM | POA: Diagnosis not present

## 2024-10-11 LAB — GLUCOSE, CAPILLARY: Glucose-Capillary: 141 mg/dL — ABNORMAL HIGH (ref 70–99)

## 2024-10-11 LAB — POCT GLYCOSYLATED HEMOGLOBIN (HGB A1C): HbA1c, POC (controlled diabetic range): 7.6 % — AB (ref 0.0–7.0)

## 2024-10-11 MED ORDER — CELECOXIB 100 MG PO CAPS: 100.0000 mg | ORAL_CAPSULE | Freq: Two times a day (BID) | ORAL | 0 refills | Status: AC

## 2024-10-11 MED ORDER — GABAPENTIN 400 MG PO CAPS: 400.0000 mg | ORAL_CAPSULE | Freq: Two times a day (BID) | ORAL | 3 refills | Status: AC

## 2024-10-11 MED ORDER — METFORMIN HCL ER 500 MG PO TB24: 1000.0000 mg | ORAL_TABLET | Freq: Two times a day (BID) | ORAL | 3 refills | Status: AC

## 2024-10-11 NOTE — Patient Instructions (Addendum)
 Thank you, [name] for allowing us  to provide your care today. Today we discussed the following:  BLOOD PRESSURE: Continue to take Oolmesartan-amlodipine -hydrochlorothiazide 40-10-25 mg tablet daily for blood pressure control.  DIABETES: We will increase your Metformin  dosing to 2 tablets (1000 mg) in the morning with breakfast AND 2 tablets (1000 mg) in the evening with dinner.  Continue to take Rybelsus  7 mg once daily.  Continue to keep up the good work with managing your diabetes!  KNEE PAIN: I have placed a referral to physical therapy and to our Sports Medicine specialists. At home, I recommend trying lidocaine  patches and Voltaren  gel for pain relief. Take Tylenol  650 mg 1 tablet up to 4 times per day. I have prescribed a 14 day course of Celebrex  that you will take twice daily for your knee. If it is not improving, please call to schedule an appointment with the Sports Medicine knee specialists.  VACCINES: I do recommend obtaining your Shingles vaccine from your [facility] pharmacy as we do not have it in our office.   COLON CANCER SCREENING: I placed a referral to the GI (stomach) doctors for your colonoscopy. They will call you to set this up.    I have ordered the following labs for you:  Lab Orders         Glucose, capillary         POC Hbg A1C       Referrals ordered today:   Referral Orders         Ambulatory referral to Gastroenterology         Ambulatory referral to Sports Medicine         Ambulatory referral to Physical Therapy       I have ordered the following medication/changed the following medications:    Start the following medications: Meds ordered this encounter  Medications   metFORMIN  (GLUCOPHAGE -XR) 500 MG 24 hr tablet    Sig: Take 2 tablets (1,000 mg total) by mouth 2 (two) times daily with a meal.    Dispense:  360 tablet    Refill:  3   celecoxib  (CELEBREX ) 100 MG capsule    Sig: Take 1 capsule (100 mg total) by mouth 2 (two) times daily  for 14 days.    Dispense:  28 capsule    Refill:  0   gabapentin  (NEURONTIN ) 400 MG capsule    Sig: Take 1 capsule (400 mg total) by mouth 2 (two) times daily.    Dispense:  60 capsule    Refill:  3     Follow up: 3 months for diabetes and blood pressure recheck    Should you have any questions or concerns please call the Internal Medicine Clinic at [phone].     Cameron Miyamoto, MD [facility]

## 2024-10-11 NOTE — Progress Notes (Unsigned)
 Internal Medicine Clinic Attending  Case discussed with the resident at the time of the visit.  We reviewed the resident's history and exam and pertinent patient test results.  I agree with the assessment, diagnosis, and plan of care documented in the resident's note.

## 2024-10-11 NOTE — Progress Notes (Unsigned)
 "  Patient name: Cameron Allen Date of birth: 1966-07-27 Date of visit: 10/11/2024  Type of visit: Established Patient Office Visit   Subjective   Chief concern:  Chief Complaint  Patient presents with   Follow-up   Medication Refill    Gabapentin  Olmesartan     Knee Pain    Left knee pain    Cameron Allen is a 58 y.o. male with a history of DMII, HTN who presents to [facility] clinic for follow up of chronic conditions.  Phone [place] interpreter (Risuin) was used for interpretation of this visit.  Patient was last seen in the [facility] on 07/05/2024 for an acute visit regarding his left knee.  Prior to that he was seen on 06/13/2024 for his diabetes and hypertension.  A1c on 7/18 was 9.3%.  He is managed with Rybelsus  7 mg daily.  At that visit he was initiated on metformin  500 mg daily, and initiated on gabapentin  400 mg twice daily for his neuropathy which was helping his symptoms.  Blood pressure at that visit was 136/90, and he was managed with olmesartan -amlodipine -hydrochlorothiazide 40-10-25 mg tablet daily.  He would have advised to buy a blood pressure cuff and begin checking his blood pressures at home, and to bring a log to his next visit.  - did take his BP medication this morning - A1c today 7.6 - no GI side effect with Metformin   - colonoscopy ordered  - Pneumonia shot and Hepatitis B vaccine today   - left knee: pain ongoing for 3 months; no fall or injury he can recall; has had some swelling occasionally in the back; painful popping occasionally; pain front of knee,  - has tried Tylenol  Arthritis which helps a little bit - has tried IcyHot which does not help  - no prior injury or surgery   ***ROS: Denies headaches, dizziness, fever, chills, runny nose, sore throat, vision changes, hearing changes, chest pain, shortness of breath, difficulty breathing, nausea, vomiting, abdominal pain. Denies increased urinary frequency, pain with urination, constipation or diarrhea. No recent  falls.   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   celecoxib (CELEBREX) 100 mg, Oral, 2 times daily   diclofenac  Sodium (VOLTAREN ) 4 g, Topical, 4 times daily, To hands and feet to relieve pain.   gabapentin  (NEURONTIN ) 400 mg, Oral, 2 times daily   metFORMIN  (GLUCOPHAGE -XR) 1,000 mg, Oral, 2 times daily with meals   Olmesartan -amLODIPine -HCTZ 40-10-25 MG TABS 1 tablet, Oral, Daily   Rybelsus  7 mg, Oral, Daily   triamcinolone  ointment (KENALOG ) 0.1 % 1 Application, Topical, 2 times daily    Social History[1]    Objective  Today's Vitals   10/11/24 0850 10/11/24 0901 10/11/24 0902  BP: (!) 137/100 (!) 132/90   Pulse: 70 71   Temp: 97.7 F (36.5 C)    TempSrc: Oral    SpO2: 95%    Weight: 180 lb 3.2 oz (81.7 kg)    Height: 5' 6 (1.676 m)    PainSc:   10-Worst pain ever  Body mass index is 29.09 kg/m.  Physical Exam:   Constitutional: well-appearing *** sitting in exam chair, in no acute distress. Ambulates without use of assistance device *** HEENT: normocephalic atraumatic, mucous membranes moist Eyes: conjunctiva non-erythematous Neck: supple Cardiovascular: regular rate and rhythm, bilateral radial pulses 2+, bilateral dorsal pedal pulses 2+, brisk capillary refill bilateral feet and hands  Pulmonary/Chest: normal work of breathing on room air, lungs clear to auscultation bilaterally Abdominal: soft, non-tender, non-distended MSK: normal bulk and tone. Neurological: alert & oriented x 3, sensation intact bilateral feet to  monofilament*** Skin: warm and dry, no ulcers or lesions on bilateral feet*** Psych: mood calm, behavior normal, thought content normal, judgement normal    {Labs (Optional):23779}  The 10-year ASCVD risk score (Arnett DK, et al., 2019) is: 12.9%   Values used to calculate the score:     Age: 22 years     Clinically relevant sex: Male     Is Non-Hispanic African American: No     Diabetic: Yes     Tobacco smoker: No     Systolic Blood Pressure: 132 mmHg     Is BP treated: No     HDL Cholesterol: 51 mg/dL     Total Cholesterol: 199 mg/dL      Assessment & Plan  Problem List Items Addressed This Visit     Type 2 diabetes mellitus (HCC) - Primary   Relevant Medications   metFORMIN  (GLUCOPHAGE -XR) 500 MG 24 hr tablet   gabapentin  (NEURONTIN ) 400 MG capsule   Other Relevant Orders   POC Hbg A1C (Completed)   Healthcare maintenance   Relevant Orders   Pneumococcal conjugate vaccine 20-valent (Prevnar 20) (Completed)   Other Visit Diagnoses       Screening for colon cancer       Relevant Orders   Ambulatory referral to Gastroenterology     Chronic pain of left knee       Relevant Medications   celecoxib (CELEBREX) 100 MG capsule   gabapentin  (NEURONTIN ) 400 MG capsule   Other Relevant Orders   Ambulatory referral to Sports Medicine   Ambulatory referral to Physical Therapy       Return in about 3 months (around 01/09/2025) for A1C recheck, BP recheck.  Patient discussed with Dr. Shawn.  Doyal Miyamoto, MD [facility]  PGY-1 10/11/2024, 10:10 AM      [1]  Social History Tobacco Use   Smoking status: Never  Substance Use Topics   Alcohol use: No   Drug use: No   "

## 2024-10-14 DIAGNOSIS — G8929 Other chronic pain: Secondary | ICD-10-CM | POA: Insufficient documentation

## 2024-10-14 NOTE — Assessment & Plan Note (Signed)
 Agreeable to colonoscopy referral and receiving pneumonia vaccine and hepatitis B vaccine today. He will need to have Heplisav-B  dose 2 at his next visit.  Discussed with him that I do recommend he obtain shingles vaccine through his pharmacy as we do not carry this vaccine. - GI referral for Colonoscopy  - given Heplisav dose 1 today; will need dose 2 at next visit  - given Prevnar 20 today

## 2024-10-14 NOTE — Assessment & Plan Note (Signed)
 Patient presents with chronic pain of the left knee which has been ongoing for the past 3 months.  No MOI or trauma to the knee that he can recall.  No recent falls.  He notes some pain anteriorly along the joint line as well as posteriorly in the popliteal space.  He does have some mild swelling posteriorly, and suspect that he may have a Baker's cyst. Negative Homans' sign.  Discussed conservative therapies including Voltaren  gel, lidocaine  patches, Tylenol  650 mg 1 tablet up to 4 times per day, and I will prescribe a 14 day course of Celebrex . Discussed that I would like him to start PT, and if pain is not improved with conservative treatment, then to follow up with Sports Medicine.  - RICE: Voltaren  gel, lidocaine  patches, Tylenol  650 mg 1 tablet up to 4 times per day - Celebrex  100 mg BID for 14 days - Referral to Sports Medicine

## 2024-10-14 NOTE — Assessment & Plan Note (Addendum)
 Status: Improving.  Last A1c on 05/10/2024 was 9.3, and has improved today to 7.6%.  He is currently managed with metformin  500 mg daily and Rybelsus  7 mg daily.  He has no reported GI side effects with the metformin , and we discussed increasing his dose to 1000 mg twice daily which he is agreeable to do.  Bilateral neuropathy managed well with gabapentin  400 mg twice daily, and I am sending refill for today. - Increase to Metformin  1000 mg BID - Continue Rybelsus  7 mg daily - Continue Gabapentin  400 mg BID  - f/u in 3 months for A1c repeat

## 2024-11-28 ENCOUNTER — Encounter: Payer: Self-pay | Admitting: Gastroenterology
